# Patient Record
Sex: Male | Born: 1965 | Hispanic: Yes | Marital: Married | Smoking: Never smoker
Health system: Southern US, Community
[De-identification: ages and names within clinical notes are randomized; demographics above are authoritative.]

## PROBLEM LIST (undated history)

## (undated) DIAGNOSIS — G8929 Other chronic pain: Secondary | ICD-10-CM

## (undated) DIAGNOSIS — M51369 Other intervertebral disc degeneration, lumbar region without mention of lumbar back pain or lower extremity pain: Secondary | ICD-10-CM

## (undated) DIAGNOSIS — M109 Gout, unspecified: Secondary | ICD-10-CM

## (undated) DIAGNOSIS — M5136 Other intervertebral disc degeneration, lumbar region: Secondary | ICD-10-CM

## (undated) DIAGNOSIS — N400 Enlarged prostate without lower urinary tract symptoms: Secondary | ICD-10-CM

## (undated) DIAGNOSIS — M79606 Pain in leg, unspecified: Secondary | ICD-10-CM

## (undated) DIAGNOSIS — I10 Essential (primary) hypertension: Secondary | ICD-10-CM

## (undated) HISTORY — PX: LEG SURGERY: SHX1003

---

## 2015-01-28 ENCOUNTER — Encounter: Payer: Self-pay | Admitting: Emergency Medicine

## 2015-01-28 ENCOUNTER — Emergency Department
Admission: EM | Admit: 2015-01-28 | Discharge: 2015-01-28 | Disposition: A | Discharge status: Home / Self Care | Payer: Medicaid Other | Attending: Emergency Medicine | Admitting: Emergency Medicine

## 2015-01-28 DIAGNOSIS — G8921 Chronic pain due to trauma: Secondary | ICD-10-CM | POA: Insufficient documentation

## 2015-01-28 DIAGNOSIS — M10071 Idiopathic gout, right ankle and foot: Secondary | ICD-10-CM | POA: Diagnosis not present

## 2015-01-28 DIAGNOSIS — I10 Essential (primary) hypertension: Secondary | ICD-10-CM | POA: Diagnosis not present

## 2015-01-28 DIAGNOSIS — Z760 Encounter for issue of repeat prescription: Secondary | ICD-10-CM | POA: Diagnosis not present

## 2015-01-28 DIAGNOSIS — M109 Gout, unspecified: Secondary | ICD-10-CM

## 2015-01-28 HISTORY — DX: Gout, unspecified: M10.9

## 2015-01-28 HISTORY — DX: Other intervertebral disc degeneration, lumbar region: M51.36

## 2015-01-28 HISTORY — DX: Pain in leg, unspecified: M79.606

## 2015-01-28 HISTORY — DX: Benign prostatic hyperplasia without lower urinary tract symptoms: N40.0

## 2015-01-28 HISTORY — DX: Other chronic pain: G89.29

## 2015-01-28 HISTORY — DX: Essential (primary) hypertension: I10

## 2015-01-28 HISTORY — DX: Other intervertebral disc degeneration, lumbar region without mention of lumbar back pain or lower extremity pain: M51.369

## 2015-01-28 MED ORDER — NAPROXEN 500 MG PO TABS: 500.0000 mg | ORAL_TABLET | Freq: Two times a day (BID) | ORAL | Status: AC

## 2015-01-28 MED ORDER — ATENOLOL 25 MG PO TABS: 25.0000 mg | ORAL_TABLET | Freq: Two times a day (BID) | ORAL | Status: AC

## 2015-01-28 MED ORDER — HYDROCODONE-ACETAMINOPHEN 5-325 MG PO TABS
1.0000 | ORAL_TABLET | Freq: Once | ORAL | Status: AC
  Administered 2015-01-28: 1 via ORAL
  Filled 2015-01-28: qty 1

## 2015-01-28 MED ORDER — NAPROXEN 500 MG PO TABS: 500.0000 mg | ORAL_TABLET | Freq: Two times a day (BID) | ORAL | Status: DC

## 2015-01-28 MED ORDER — HYDROCHLOROTHIAZIDE 25 MG PO TABS: 25.0000 mg | ORAL_TABLET | Freq: Every day | ORAL | Status: DC

## 2015-01-28 MED ORDER — TAMSULOSIN HCL 0.4 MG PO CAPS: 0.4000 mg | ORAL_CAPSULE | Freq: Every day | ORAL | Status: DC

## 2015-01-28 MED ORDER — HYDROCODONE-ACETAMINOPHEN 5-325 MG PO TABS: 1.0000 | ORAL_TABLET | ORAL | Status: DC | PRN

## 2015-01-28 NOTE — ED Provider Notes (Signed)
[facility] Emergency Department Provider Note ____________________________________________  Time seen: Approximately 1:21 PM  I have reviewed the triage vital signs and the nursing notes.   HISTORY     Per the Spanish interpreter  Chief Complaint Medication Refill  HPI Dillon Acosta is a 49 y.o. male is here with complaint of acute gout flare after eating pork yesterday. Patient is [place] and states that he takes one medication for gout and another just for pain. He also has prescriptions for his prostate and blood pressure. He has bottles for all 3 and is requesting a refill on these as he is unsure how long he will be in the [place] since his wife is looking for a job. He denies having any difficulty with taking medication. He can only give the color of the pill that he is taking for gout. He denies any GI problems. Currently he rates his pain as a 7 out of 10. Pain is constant and increases with walking.   Past Medical History  Diagnosis Date  . Gout   . Hypertension   . BPH (benign prostatic hyperplasia)   . Chronic leg pain   . DDD (degenerative disc disease), lumbar     There are no active problems to display for this patient.   Past Surgical History  Procedure Laterality Date  . Leg surgery Left     Current Outpatient Rx  Name  Route  Sig  Dispense  Refill  . Naproxen Sodium (ANAPROX PO)   Oral   Take by mouth as needed (Daily).         Marland Kitchen. atenolol (TENORMIN) 25 MG tablet   Oral   Take 1 tablet (25 mg total) by mouth 2 (two) times daily.   60 tablet   0   . hydrochlorothiazide (HYDRODIURIL) 25 MG tablet   Oral   Take 1 tablet (25 mg total) by mouth daily.   30 tablet   0   . HYDROcodone-acetaminophen (NORCO/VICODIN) 5-325 MG tablet   Oral   Take 1 tablet by mouth every 4 (four) hours as needed for moderate pain.   20 tablet   0   . naproxen (NAPROSYN) 500 MG tablet   Oral   Take 1 tablet (500 mg total)  by mouth 2 (two) times daily with a meal.   30 tablet   0   . tamsulosin (FLOMAX) 0.4 MG CAPS capsule   Oral   Take 1 capsule (0.4 mg total) by mouth daily.   30 capsule   0     Allergies Review of patient's allergies indicates no known allergies.  No family history on file.  Social History Social History  Substance Use Topics  . Smoking status: Never Smoker   . Smokeless tobacco: None  . Alcohol Use: No    Review of Systems Constitutional: No fever/chills Cardiovascular: Denies chest pain. Respiratory: Denies shortness of breath. Gastrointestinal: No abdominal pain.  No nausea, no vomiting.   Musculoskeletal: Negative for back pain. Positive right foot pain. Positive left chronic leg pain secondary to trauma. Skin: Negative for rash. Chronic scar tissue left leg due to trauma. Neurological: Negative for headaches, focal weakness or numbness.  10-point ROS otherwise negative.  ____________________________________________   PHYSICAL EXAM:  VITAL SIGNS: ED Triage Vitals  Enc Vitals Group     BP 01/28/15 1306 129/84 mmHg     Pulse Rate 01/28/15 1306 69     Resp 01/28/15 1306 16  Temp 01/28/15 1306 97.4 F (36.3 C)     Temp Source 01/28/15 1306 Oral     SpO2 01/28/15 1306 97 %     Weight 01/28/15 1306 180 lb (81.647 kg)     Height 01/28/15 1306  (1.676 m)     Head Cir --      Peak Flow --      Pain Score 01/28/15 1251 7     Pain Loc --      Pain Edu? --      Excl. in GC? --     Constitutional: Alert and oriented. Well appearing and in no acute distress. Eyes: Conjunctivae are normal. PERRL. EOMI. Head: Atraumatic. Nose: No congestion/rhinnorhea. Neck: No stridor.   Cardiovascular: Normal rate, regular rhythm. Grossly normal heart sounds.  Good peripheral circulation. Respiratory: Normal respiratory effort.  No retractions. Lungs CTAB. Gastrointestinal: Soft and nontender. No distention. Musculoskeletal: No gross deformity of the right foot or  ankle. There is moderate tenderness to palpation and range of motion is slightly restricted secondary to pain. Neurologic:  Normal speech and language. No gross focal neurologic deficits are appreciated. No gait instability. Skin:  Skin is warm, dry and intact. Chronic scar tissue left leg secondary to trauma. Psychiatric: Mood and affect are normal. Speech and behavior are normal.  ____________________________________________   LABS (all labs ordered are listed, but only abnormal results are displayed)  Labs Reviewed - No data to display  PROCEDURES  Procedure(s) performed: None  Critical Care performed: No  ____________________________________________   INITIAL IMPRESSION / ASSESSMENT AND PLAN / ED COURSE  Pertinent labs & imaging results that were available during my care of the patient were reviewed by me and considered in my medical decision making (see chart for details).  Is uncertain what kind of medication patient is been taking for his gout while in [place] as she only noticed the color of the pill. However he is willing to take medication here that is used for gout. Patient was also given refill of his normal medication including atenolol 25 mg, Hydrocort thiazide 25 mg and Flomax 0.4 mg. Patient is aware that should his wife finally job he will be responsible for finding a PCP to continue his healthcare. He is also given a prescription for Norco as needed for severe pain and naproxen for gout and inflammation. ____________________________________________   FINAL CLINICAL IMPRESSION(S) / ED DIAGNOSES  Final diagnoses:  Acute gout of right foot, unspecified cause  Encounter for medication refill      Tommi Rumps, PA-C 01/28/15 1441  Phineas Semen, MD 01/28/15 [phone]

## 2015-01-28 NOTE — ED Notes (Signed)
Per spanish interpreter, pt left prescription for gout medication in [place], pt reports eating pork yesterday and now has a gout exacerbation. Pt reports scarring to left leg, reports he left his prescription for his pain medication [place] as well.

## 2015-01-28 NOTE — ED Notes (Signed)
Says pain in ankle.  Usually takes meds, but he left them in [place] and needs more

## 2015-01-28 NOTE — Discharge Instructions (Signed)
Gota  °(Gout) ° La gota es la irritación, dolor e hinchazón (inflamación) en las articulaciones. La causa de la gota es la acumulación de cristales de ácido úrico en las articulaciones. El ácido úrico es una sustancia química que normalmente se encuentra en la sangre. Si el nivel de ácido úrico en la sangre es muy alto, se forman cristales en las articulaciones y los tejidos. Con el tiempo, estos cristales pueden formar masas cerca de las articulaciones y los tejidos. Estas masas pueden destruir el hueso y hacer que se modifique (deformación). °CUIDADOS EN EL HOGAR  °· No tome aspirina para el dolor. °· Sólo tome los medicamentos que le indique el médico. °· Haga descansar las articulaciones todo el tiempo que pueda. Cuando se encuentre en la cama, mantenga las sábanas y mantas alejadas de las articulaciones doloridas. °· Mantenga las articulaciones levantadas (elevada). °· Aplique compresas frías o calientes sobre las articulaciones doloridas. El uso de compresas calientes o frías depende de lo que mejor le resulte a usted. °· Utilice muletas si la articulación que le duele es de la pierna. °· Beba gran cantidad de líquido para mantener el pis (orina) de tono claro o amarillo pálido. Limite el consumo de alcohol, las bebidas azucaradas y las que contengan fructosa. °· Siga las instrucciones de su dieta. . Preste especial atención a la cantidad de proteínas que consume. Incluya frutas, verduras, granos integrales y productos lácteos descremados o semi descremados en su dieta diaria. Hable con su médico o nutricionista sobre el consumo del café, vitamina C y cerezas. Estos pueden ayudar a disminuir los niveles de ácido úrico. °· Mantenga un peso corporal adecuado. °SOLICITE AYUDA DE INMEDIATO SI:  °· Tiene deposiciones líquidas (diarrea), vomita o si sufre algún efecto secundario por los medicamentos °· No se siente mejor en 24 horas, o empeora. °· Las articulaciones le duelen más de manera repentina, tiene  escalofríos o fiebre. °ASEGÚRESE DE QUE:  °· Comprende estas instrucciones. °· Controlará su enfermedad. °· Solicitará ayuda de inmediato si no mejora o si empeora. °  °Esta información no tiene como fin reemplazar el consejo del médico. Asegúrese de hacerle al médico cualquier pregunta que tenga. °  °Document Released: 02/08/2011 Document Revised: 03/12/2014 °Elsevier Interactive Patient Education ©2016 Elsevier Inc. ° °

## 2015-02-15 DIAGNOSIS — I10 Essential (primary) hypertension: Secondary | ICD-10-CM | POA: Insufficient documentation

## 2015-02-15 DIAGNOSIS — M109 Gout, unspecified: Secondary | ICD-10-CM | POA: Insufficient documentation

## 2015-02-15 DIAGNOSIS — G8929 Other chronic pain: Secondary | ICD-10-CM | POA: Insufficient documentation

## 2015-02-15 DIAGNOSIS — N400 Enlarged prostate without lower urinary tract symptoms: Secondary | ICD-10-CM | POA: Insufficient documentation

## 2015-02-15 DIAGNOSIS — M79606 Pain in leg, unspecified: Secondary | ICD-10-CM

## 2015-02-15 DIAGNOSIS — M5136 Other intervertebral disc degeneration, lumbar region: Secondary | ICD-10-CM | POA: Insufficient documentation

## 2015-02-17 ENCOUNTER — Ambulatory Visit (INDEPENDENT_AMBULATORY_CARE_PROVIDER_SITE_OTHER): Payer: Medicare Other | Admitting: Obstetrics and Gynecology

## 2015-02-17 ENCOUNTER — Encounter: Payer: Self-pay | Admitting: Obstetrics and Gynecology

## 2015-02-17 VITALS — BP 109/73 | HR 69 | Resp 16 | Ht 66.0 in | Wt 193.5 lb

## 2015-02-17 DIAGNOSIS — R972 Elevated prostate specific antigen [PSA]: Secondary | ICD-10-CM

## 2015-02-17 DIAGNOSIS — N3000 Acute cystitis without hematuria: Secondary | ICD-10-CM | POA: Diagnosis not present

## 2015-02-17 LAB — URINALYSIS, COMPLETE
BILIRUBIN UA: NEGATIVE
Glucose, UA: NEGATIVE
Ketones, UA: NEGATIVE
NITRITE UA: POSITIVE — AB
PH UA: 5.5 (ref 5.0–7.5)
Protein, UA: NEGATIVE
Specific Gravity, UA: 1.025 (ref 1.005–1.030)
UUROB: 0.2 mg/dL (ref 0.2–1.0)

## 2015-02-17 LAB — BLADDER SCAN AMB NON-IMAGING

## 2015-02-17 LAB — MICROSCOPIC EXAMINATION: EPITHELIAL CELLS (NON RENAL): NONE SEEN /HPF (ref 0–10)

## 2015-02-17 NOTE — Patient Instructions (Addendum)
  Please obtain from your previous provider:  PSA lab tests  Prostate biopsy pathology report  Previous antibiotic medications

## 2015-02-17 NOTE — Progress Notes (Signed)
02/17/2015 11:04 AM   Dillon Acosta 1965/04/13 [id number]  Referring provider: No referring provider defined for this encounter.  Chief Complaint  Patient presents with  . Elevated PSA  . Dysuria  . Establish Care    HPI: Patient is a 49 year old Hispanic male presenting today with a Spanish interpreter to establish urologic care. He reports a history of an elevated PSA status post prostate biopsy 7 months ago. He reports that he was told that his biopsy results were negative for cancer but that he had a severe prostate infection. He was scheduled for follow-up in January for recheck but has since moved to the [place] with his niece and would like to continue his urologic care here.  Urinary symptoms including occasional sensation of incomplete bladder emptying, dysuria and nocturia 3-4 times per night. He denies any day times frequency, urgency or incontinence. I-PSS: 10 QOL: mostly satisfied  He states that after his prostate biopsy he was started on Flomax as well as an antibiotic for him to take until his follow-up appointment. He reports minimal improvement in urinary symptoms.   FMH: Brother had metastatic prostate cancer.  Deceased at age 41.    PMH: Past Medical History  Diagnosis Date  . Gout   . Hypertension   . BPH (benign prostatic hyperplasia)   . Chronic leg pain   . DDD (degenerative disc disease), lumbar     Surgical History: Past Surgical History  Procedure Laterality Date  . Leg surgery Left     Home Medications:    Medication List       This list is accurate as of: 02/17/15 11:04 AM.  Always use your most recent med list.               atenolol 25 MG tablet  Commonly known as:  TENORMIN  Take 1 tablet (25 mg total) by mouth 2 (two) times daily.     buPROPion 150 MG 12 hr tablet  Commonly known as:  WELLBUTRIN SR  Take by mouth 2 (two) times daily.     hydrochlorothiazide 25 MG tablet  Commonly known as:  HYDRODIURIL  Take 1 tablet (25 mg  total) by mouth daily.     HYDROcodone-acetaminophen 5-325 MG tablet  Commonly known as:  NORCO/VICODIN  Take 1 tablet by mouth every 4 (four) hours as needed for moderate pain.     naproxen 500 MG tablet  Commonly known as:  NAPROSYN  Take 1 tablet (500 mg total) by mouth 2 (two) times daily with a meal.     pantoprazole 40 MG tablet  Commonly known as:  PROTONIX  Take 40 mg by mouth daily.     tamsulosin 0.4 MG Caps capsule  Commonly known as:  FLOMAX  Take 1 capsule (0.4 mg total) by mouth daily.     temazepam 7.5 MG capsule  Commonly known as:  RESTORIL  Take 7.5 mg by mouth at bedtime as needed for sleep.        Allergies: No Known Allergies  Family History: Family History  Problem Relation Age of Onset  . Prostate cancer Brother     Social History:  reports that he has never smoked. He does not have any smokeless tobacco history on file. He reports that he does not drink alcohol. His drug history is not on file.  ROS: UROLOGY Frequent Urination?: Yes Hard to postpone urination?: Yes Burning/pain with urination?: No Get up at night to urinate?: Yes Leakage of urine?: Yes Urine  stream starts and stops?: Yes Trouble starting stream?: No Do you have to strain to urinate?: No Blood in urine?: No Urinary tract infection?: Yes Sexually transmitted disease?: No Injury to kidneys or bladder?: No Painful intercourse?: No Weak stream?: No Erection problems?: No Penile pain?: No  Gastrointestinal Nausea?: No Vomiting?: No Indigestion/heartburn?: Yes Diarrhea?: No Constipation?: Yes  Constitutional Fever: No Night sweats?: No Weight loss?: No Fatigue?: No  Skin Skin rash/lesions?: No Itching?: No  Eyes Blurred vision?: Yes Double vision?: No  Ears/Nose/Throat Sore throat?: No Sinus problems?: Yes  Hematologic/Lymphatic Swollen glands?: No Easy bruising?: Yes  Cardiovascular Leg swelling?: No Chest pain?: No  Respiratory Cough?:  No Shortness of breath?: No  Endocrine Excessive thirst?: Yes  Musculoskeletal Back pain?: Yes Joint pain?: Yes  Neurological Headaches?: Yes Dizziness?: No  Psychologic Depression?: Yes Anxiety?: Yes  Physical Exam: BP 109/73 mmHg  Pulse 69  Resp 16  Ht 5\' 6"  (1.676 m)  Wt 193 lb 8 oz (87.771 kg)  BMI 31.25 kg/m2  Constitutional:  Alert and oriented, No acute distress. HEENT: [place] AT, moist mucus membranes.  Trachea midline, no masses. Cardiovascular: No clubbing, cyanosis, or edema. Respiratory: Normal respiratory effort, no increased work of breathing. GI: Abdomen is soft, nontender, nondistended, no abdominal masses GU: No CVA tenderness. Normal circumcised phallus, patent urinary meatus, testicles descended bilaterally without palpable masses or tenderness  DRE: small smooth slightly firm prostate, nontender Skin: No rashes, bruises or suspicious lesions. Lymph: No cervical or inguinal adenopathy. Neurologic: Grossly intact, no focal deficits, moving all 4 extremities. Psychiatric: Normal mood and affect.  Laboratory Data:   Urinalysis Results for orders placed or performed in visit on 02/17/15  Microscopic Examination  Result Value Ref Range   WBC, UA 11-30 (A) 0 -  5 /hpf   RBC, UA 0-2 0 -  2 /hpf   Epithelial Cells (non renal) None seen 0 - 10 /hpf   Bacteria, UA Many (A) None seen/Few  Urinalysis, Complete  Result Value Ref Range   Specific Gravity, UA 1.025 1.005 - 1.030   pH, UA 5.5 5.0 - 7.5   Color, UA Yellow Yellow   Appearance Ur Clear Clear   Leukocytes, UA Trace (A) Negative   Protein, UA Negative Negative/Trace   Glucose, UA Negative Negative   Ketones, UA Negative Negative   RBC, UA Trace (A) Negative   Bilirubin, UA Negative Negative   Urobilinogen, Ur 0.2 0.2 - 1.0 mg/dL   Nitrite, UA Positive (A) Negative   Microscopic Examination See below:   BLADDER SCAN AMB NON-IMAGING  Result Value Ref Range   Scan Result 117 mL       Pertinent Imaging:   Assessment & Plan:    1. Elevated PSA- Prostate slightly firm on DRE or 3 otherwise normal exam. PSA draw deferred until next visit after UTI treatment complete. - Urinalysis, Complete - BLADDER SCAN AMB NON-IMAGING  2. Urinary tract infection-  UA today suspicious for infection.  30 WBCs, 0-2 RBCs, many bacteria and is nitrite positive. We'll treat pending culture results. - CULTURE, URINE COMPREHENSIVE  3. Nocturia- 3-4 times per night.  Unclear if patient is still taking his Flomax. Will address further at next visit.  Additional notes and/or imaging study requested from patient's previous Urologist in [place].  Patient states that he will request them to be faxed to us.  Return in about 1 month (around 03/20/2015) for PSA draw 2 days prior to appt.  These notes generated with voice  recognition software. I apologize for typographical errors.  Earlie Lou, FNP  [facility] [address], [address] [place], [place] [address] [phone]

## 2015-02-20 LAB — CULTURE, URINE COMPREHENSIVE

## 2015-02-21 ENCOUNTER — Telehealth: Payer: Self-pay

## 2015-02-21 DIAGNOSIS — N390 Urinary tract infection, site not specified: Secondary | ICD-10-CM

## 2015-02-21 MED ORDER — NITROFURANTOIN MONOHYD MACRO 100 MG PO CAPS
100.0000 mg | ORAL_CAPSULE | Freq: Two times a day (BID) | ORAL | Status: AC
Start: 2015-02-21 — End: 2015-02-28

## 2015-02-21 NOTE — Telephone Encounter (Signed)
Spoke with pt via interpreter. Made pt aware of +ucx and abx sent to pharmacy. Pt stated that he picked up his flomax yesterday and started taking it.

## 2015-02-21 NOTE — Telephone Encounter (Signed)
-----   Message from Fernanda DrumLindsay C Overton, FNP sent at 02/21/2015 10:33 AM EST ----- Please notify patient yes Spanish interpreter that his urine culture results were positive for infection. I would like him to be treated with Macrobid 100 mg twice daily 1 week. He can keep his follow-up appointment as scheduled in 1 month. Patient was unclear during his visit whether or not he was taking his Flomax area we please question him further on this and if he is not taking it send in a prescription for once daily Flomax 0.4 mg as well. Thank you

## 2015-03-10 ENCOUNTER — Telehealth: Payer: Self-pay | Admitting: Obstetrics and Gynecology

## 2015-03-10 DIAGNOSIS — N390 Urinary tract infection, site not specified: Secondary | ICD-10-CM

## 2015-03-10 MED ORDER — NITROFURANTOIN MONOHYD MACRO 100 MG PO CAPS: 100.0000 mg | ORAL_CAPSULE | Freq: Two times a day (BID) | ORAL | Status: AC

## 2015-03-10 NOTE — Telephone Encounter (Signed)
Pt called and I spoke with someone who speaks [place].  They stated [facility] [place] [facility] for infection and nothing was called in.  Can you please give pt a call at [phone].

## 2015-03-10 NOTE — Telephone Encounter (Signed)
Attempted to call pt and was unable to reach. Medication was sent to [facility] on [address]. Med[facility] to [facility] [place].

## 2015-03-17 ENCOUNTER — Telehealth: Payer: Self-pay | Admitting: Obstetrics and Gynecology

## 2015-03-17 NOTE — Telephone Encounter (Signed)
Per patient's request, I faxed request to patient's former urology physician for patient's records to be sent to our office.  Phone: [phone] Fax: [phone]) [phone]

## 2015-03-22 ENCOUNTER — Other Ambulatory Visit: Payer: Medicare Other

## 2015-03-22 DIAGNOSIS — R972 Elevated prostate specific antigen [PSA]: Secondary | ICD-10-CM

## 2015-03-23 LAB — PSA TOTAL (REFLEX TO FREE): PROSTATE SPECIFIC AG, SERUM: 8.7 ng/mL — AB (ref 0.0–4.0)

## 2015-03-23 LAB — FPSA% REFLEX
% FREE PSA: 6.7 %
PSA, FREE: 0.58 ng/mL

## 2015-03-24 ENCOUNTER — Encounter: Payer: Self-pay | Admitting: Obstetrics and Gynecology

## 2015-03-24 ENCOUNTER — Ambulatory Visit (INDEPENDENT_AMBULATORY_CARE_PROVIDER_SITE_OTHER): Payer: Medicare (Managed Care) | Admitting: Obstetrics and Gynecology

## 2015-03-24 ENCOUNTER — Telehealth: Payer: Self-pay | Admitting: Obstetrics and Gynecology

## 2015-03-24 VITALS — BP 144/83 | HR 71 | Resp 18 | Ht 66.0 in | Wt 196.7 lb

## 2015-03-24 DIAGNOSIS — R351 Nocturia: Secondary | ICD-10-CM

## 2015-03-24 DIAGNOSIS — R972 Elevated prostate specific antigen [PSA]: Secondary | ICD-10-CM

## 2015-03-24 DIAGNOSIS — R109 Unspecified abdominal pain: Secondary | ICD-10-CM

## 2015-03-24 DIAGNOSIS — Z87442 Personal history of urinary calculi: Secondary | ICD-10-CM

## 2015-03-24 DIAGNOSIS — N390 Urinary tract infection, site not specified: Secondary | ICD-10-CM

## 2015-03-24 LAB — MICROSCOPIC EXAMINATION
EPITHELIAL CELLS (NON RENAL): NONE SEEN /HPF (ref 0–10)
RBC MICROSCOPIC, UA: NONE SEEN /HPF (ref 0–?)

## 2015-03-24 LAB — URINALYSIS, COMPLETE
Bilirubin, UA: NEGATIVE
KETONES UA: NEGATIVE
NITRITE UA: POSITIVE — AB
Protein, UA: NEGATIVE
UUROB: 0.2 mg/dL (ref 0.2–1.0)
pH, UA: 6 (ref 5.0–7.5)

## 2015-03-24 MED ORDER — TAMSULOSIN HCL 0.4 MG PO CAPS
0.4000 mg | ORAL_CAPSULE | Freq: Every day | ORAL | Status: AC
Start: 2015-03-24 — End: ?

## 2015-03-24 NOTE — Telephone Encounter (Signed)
I sent him the refill, please notify thanks

## 2015-03-24 NOTE — Progress Notes (Signed)
9:27 AM   Dillon Acosta Apr 28, 1965 [id number]  Referring provider: No referring provider defined for this encounter.  Chief Complaint  Patient presents with  . Results  . Elevated PSA    HPI: Patient is a 50 year old Hispanic male presenting today with a Spanish interpreter to establish urologic care. He reports a history of an elevated PSA status post prostate biopsy 7 months ago. He reports that he was told that his biopsy results were negative for cancer but that he had a severe prostate infection. He was scheduled for follow-up in January for recheck but has since moved to the [place] with his niece and would like to continue his urologic care here.  Urinary symptoms including occasional sensation of incomplete bladder emptying, dysuria and nocturia 3-4 times per night. He denies any day times frequency, urgency or incontinence. I-PSS: 10 QOL: mostly satisfied  He states that after his prostate biopsy he was started on Flomax as well as an antibiotic for him to take until his follow-up appointment. He reports minimal improvement in urinary symptoms.   FMH: Brother had metastatic prostate cancer.  Deceased at age 84.   Current Status: Patient reports that he requested his notes from [place] but we have not received a fax. He reports that his symptoms are unchanged. He has been taking his daily Flomax but did not pick up his antibiotic because it was not covered by his insurance. He denies any fevers or flank pain. No gross hematuria.  PMH: Past Medical History  Diagnosis Date  . Gout   . Hypertension   . BPH (benign prostatic hyperplasia)   . Chronic leg pain   . DDD (degenerative disc disease), lumbar     Surgical History: Past Surgical History  Procedure Laterality Date  . Leg surgery Left     Home Medications:    Medication List       This list is accurate as of: 03/24/15  9:27 AM.  Always use your most recent med list.               atenolol 25 MG tablet   Commonly known as:  TENORMIN  Take 1 tablet (25 mg total) by mouth 2 (two) times daily.     buPROPion 150 MG 12 hr tablet  Commonly known as:  WELLBUTRIN SR  Take by mouth 2 (two) times daily.     hydrochlorothiazide 25 MG tablet  Commonly known as:  HYDRODIURIL  Take 1 tablet (25 mg total) by mouth daily.     naproxen 500 MG tablet  Commonly known as:  NAPROSYN  Take 1 tablet (500 mg total) by mouth 2 (two) times daily with a meal.     pantoprazole 40 MG tablet  Commonly known as:  PROTONIX  Take 40 mg by mouth daily.     tamsulosin 0.4 MG Caps capsule  Commonly known as:  FLOMAX  Take 1 capsule (0.4 mg total) by mouth daily.     temazepam 7.5 MG capsule  Commonly known as:  RESTORIL  Take 7.5 mg by mouth at bedtime as needed for sleep.        Allergies: No Known Allergies  Family History: Family History  Problem Relation Age of Onset  . Prostate cancer Brother     Social History:  reports that he has never smoked. He does not have any smokeless tobacco history on file. He reports that he does not drink alcohol. His drug history is not on file.  ROS:  UROLOGY Frequent Urination?: Yes Hard to postpone urination?: No Burning/pain with urination?: Yes Get up at night to urinate?: Yes Leakage of urine?: Yes Urine stream starts and stops?: No Trouble starting stream?: No Do you have to strain to urinate?: No Blood in urine?: No Urinary tract infection?: No Sexually transmitted disease?: No Injury to kidneys or bladder?: No Painful intercourse?: No Weak stream?: No Erection problems?: Yes Penile pain?: No  Gastrointestinal Nausea?: No Vomiting?: No Indigestion/heartburn?: Yes Diarrhea?: No Constipation?: Yes  Constitutional Fever: No Night sweats?: No Weight loss?: No Fatigue?: No  Skin Skin rash/lesions?: No Itching?: No  Eyes Blurred vision?: No Double vision?: No  Ears/Nose/Throat Sore throat?: No Sinus problems?:  No  Hematologic/Lymphatic Swollen glands?: No Easy bruising?: No  Cardiovascular Leg swelling?: No Chest pain?: No  Respiratory Cough?: No Shortness of breath?: No  Endocrine Excessive thirst?: Yes  Musculoskeletal Back pain?: Yes Joint pain?: No  Neurological Headaches?: No Dizziness?: No  Psychologic Depression?: No Anxiety?: No  Physical Exam: BP 144/83 mmHg  Pulse 71  Resp 18  Ht  (1.676 m)  Wt 196 lb 11.2 oz (89.223 kg)  BMI 31.76 kg/m2  Constitutional:  Alert and oriented, No acute distress. HEENT: [place] AT, moist mucus membranes.  Trachea midline, no masses. Cardiovascular: No clubbing, cyanosis, or edema. Respiratory: Normal respiratory effort, no increased work of breathing. Skin: No rashes, bruises or suspicious lesions. Neurologic: Grossly intact, no focal deficits, moving all 4 extremities. Psychiatric: Normal mood and affect.  Laboratory Data:   Urinalysis Results for orders placed or performed in visit on 03/22/15  PSA Total (Reflex To Free)  Result Value Ref Range   Prostate Specific Ag, Serum 8.7 (H) 0.0 - 4.0 ng/mL   REFLEX CRITERIA Comment   %fPSA Reflex  Result Value Ref Range   PSA, FREE 0.58 N/A ng/mL   % FREE PSA 6.7 %     Pertinent Imaging:   Assessment & Plan:    1. Elevated PSA- Prostate slightly firm on DRE otherwise normal exam. ASA was elevated at 8.7 but this was in the setting of a urinary tract infection the patient did not pick his previously prescribed antibiotics as directed. We will have him complete his antibiotics and follow-up for repeat PSA in 4 weeks.He will once again requested notes including prostate biopsy report from [place] - Urinalysis, Complete - BLADDER SCAN AMB NON-IMAGING  2. Urinary tract infection- patient did not pick up his antibiotic as previously directed. He states that he has insurance would not cover it and he will pick up today. He will complete treatment of his urinary tract  infection and follow-up for repeat PSA in 4 weeks. - CULTURE, URINE COMPREHENSIVE  3. Nocturia- 3-4 times per night.  Unclear if patient is still taking his Flomax. Will address further at next visit.  4. H/o kidney stones-Patient states he also has history of kidney stones and is very concerned about the number he may have in his kidneys. He is requesting that we evaluate this further. Renal ultrasound and KUB ordered -KUB RUS  Additional notes and/or imaging study requested from patient's previous Urologist in [place]  Patient states that he will request them to be faxed to [place].  Return in about 4 weeks (around 04/21/2015) for review KUB/RUS/PSA results; UA/PVR.  These notes generated with voice recognition software. I apologize for typographical errors.  Earlie Lou, FNP  [facility] [address] [place], [place] [address] [phone]

## 2015-03-24 NOTE — Patient Instructions (Signed)
prostatBiopsia transrectal guiada por ultrasonido (Transrectal Ultrasound-Guided Biopsy) La biopsia transrectal guiada por ultrasonido es un procedimiento para tomar muestras de tejido de la prstata mediante el uso de imgenes de ultrasonido para guiarlo. Generalmente, se realiza para evaluar la prstata de los hombres cuyo antgeno prosttico especfico (PSA) est elevado. El antgeno prosttico especfico es un anlisis de sangre que se [place] para Arts administrator de prstata. Se toman muestras de tejido para Clinical cytogeneticist de prstata.  INFORME A SU MDICO:  Cualquier alergia que tenga.  Todos los [employer], incluidos vitaminas, hierbas, gotas oftlmicas, cremas y [address] de [address].  Problemas previos que usted o los [employer] de su familia hayan tenido con el uso de anestsicos.  Enfermedades de la sangre que tenga.  Cirugas previas.  Enfermedades que tenga. RIESGOS Y COMPLICACIONES En general, se trata de un procedimiento seguro. Sin embargo, Photographer, pueden surgir problemas. Algunos posibles problemas incluyen:  Infeccin de la prstata.  Sangrado rectal o sangre en la orina.  Dificultad para orinar.  Dao a los nervios (suele ser pasajero).  Dao a las estructuras circundantes, como vasos sanguneos, rganos y [employer] requerir otros procedimientos. ANTES DEL PROCEDIMIENTO  No coma ni beba nada despus de la medianoche anterior al procedimiento o segn le haya indicado su mdico.  Tome los medicamentos solamente como se lo haya indicado el mdico.  Es posible que el mdico le indique que suspenda ciertos medicamentos 5 a 7das antes del procedimiento.  Le harn un enema antes del procedimiento. Durante un enema, se inyecta lquido en el recto para que se eliminen los desechos.  Tal vez le realicen anlisis de sangre el da del procedimiento.  Haga planes para que una persona lo lleve de vuelta a su casa  despus del procedimiento. PROCEDIMIENTO   Antes del procedimiento, le darn un medicamento para ayudarlo a que se relaje (sedante). Le insertarn una va intravenosa (IV) en una de las venas para administrarle lquidos y medicamentos.  Le recetarn un antibitico para reducir el riesgo de infeccin.  Para el procedimiento, lo colocarn de costado.  Se introducir una sonda con gel lubricante en el recto y se tomarn imgenes de la prstata y las estructuras circundantes.  Se inyectar un anestsico en la prstata antes de tomar las muestras de tejido de la biopsia.  Luego, se introducir una aguja para biopsia que se guiar [employer] prstata mediante el uso de imgenes de [place].  Se tomarn muestras de tejido prosttico y se Multimedia programmer.  Las muestras se enviarn a un laboratorio para su anlisis. Generalmente, los resultados estn listos en 2 o 3das. DESPUS DEL PROCEDIMIENTO  Lo llevarn al rea de recuperacin donde lo controlarn.  Tal vez tenga ciertas molestias en la zona del recto. Le darn analgsicos para Human resources officer.  Es posible que pueda volver a casa el mismo da del procedimiento, o puede Environmental health practitioner en observacin en el hospital durante la noche.   Esta informacin no tiene Theme park manager el consejo del mdico. Asegrese de hacerle al mdico cualquier pregunta que tenga.   Document Released: 07/06/2013 Document Revised: 03/12/2014 Elsevier Interactive Patient Education [employer]

## 2015-03-24 NOTE — Telephone Encounter (Signed)
Pt's niece called stating that pt was seen this morning and that his Flomax is out.  Pt needs a refill called in to [facility] in [place].

## 2015-03-26 LAB — CULTURE, URINE COMPREHENSIVE

## 2015-03-28 ENCOUNTER — Telehealth: Payer: Self-pay

## 2015-03-28 NOTE — Telephone Encounter (Signed)
Spoke with pt via interpreter. Made aware of +ucx, complete given abx, call back if not any better, otherwise we will see him in 17mo. Pt voiced understanding.

## 2015-03-28 NOTE — Telephone Encounter (Signed)
-----   Message from Fernanda Drum, FNP sent at 03/28/2015  9:32 AM EST ----- Please notify patient via Spanish interpreter that his urine culture was positive for infection once again. He needs to make sure that he completes his antibiotic entirely as directed. He does notify our office if his symptoms do not improve prior to his next appointment in 1 month. Thanks

## 2015-04-12 ENCOUNTER — Ambulatory Visit
Admission: RE | Admit: 2015-04-12 | Discharge: 2015-04-12 | Disposition: A | Discharge status: Home / Self Care | Payer: Medicare Other | Source: Ambulatory Visit | Attending: Obstetrics and Gynecology | Admitting: Obstetrics and Gynecology

## 2015-04-12 DIAGNOSIS — N281 Cyst of kidney, acquired: Secondary | ICD-10-CM | POA: Diagnosis not present

## 2015-04-12 DIAGNOSIS — R351 Nocturia: Secondary | ICD-10-CM | POA: Diagnosis not present

## 2015-04-12 DIAGNOSIS — R109 Unspecified abdominal pain: Secondary | ICD-10-CM | POA: Insufficient documentation

## 2015-04-12 DIAGNOSIS — Z87442 Personal history of urinary calculi: Secondary | ICD-10-CM | POA: Insufficient documentation

## 2015-04-12 DIAGNOSIS — N390 Urinary tract infection, site not specified: Secondary | ICD-10-CM | POA: Insufficient documentation

## 2015-04-18 ENCOUNTER — Other Ambulatory Visit: Payer: Medicare Other

## 2015-04-18 ENCOUNTER — Ambulatory Visit
Admission: RE | Admit: 2015-04-18 | Discharge: 2015-04-18 | Disposition: A | Discharge status: Home / Self Care | Payer: Medicare Other | Source: Ambulatory Visit | Attending: Obstetrics and Gynecology | Admitting: Obstetrics and Gynecology

## 2015-04-18 DIAGNOSIS — N390 Urinary tract infection, site not specified: Secondary | ICD-10-CM | POA: Diagnosis present

## 2015-04-18 DIAGNOSIS — R351 Nocturia: Secondary | ICD-10-CM

## 2015-04-18 DIAGNOSIS — Z87442 Personal history of urinary calculi: Secondary | ICD-10-CM | POA: Insufficient documentation

## 2015-04-18 DIAGNOSIS — R972 Elevated prostate specific antigen [PSA]: Secondary | ICD-10-CM

## 2015-04-18 DIAGNOSIS — R1011 Right upper quadrant pain: Secondary | ICD-10-CM | POA: Diagnosis not present

## 2015-04-19 LAB — FPSA% REFLEX
% FREE PSA: 6.4 %
PSA, FREE: 0.55 ng/mL

## 2015-04-19 LAB — PSA TOTAL (REFLEX TO FREE): Prostate Specific Ag, Serum: 8.6 ng/mL — ABNORMAL HIGH (ref 0.0–4.0)

## 2015-04-21 ENCOUNTER — Ambulatory Visit (INDEPENDENT_AMBULATORY_CARE_PROVIDER_SITE_OTHER): Payer: Medicare Other | Admitting: Obstetrics and Gynecology

## 2015-04-21 ENCOUNTER — Encounter: Payer: Self-pay | Admitting: Obstetrics and Gynecology

## 2015-04-21 VITALS — BP 134/81 | HR 87 | Resp 16 | Ht 66.0 in | Wt 199.1 lb

## 2015-04-21 DIAGNOSIS — N390 Urinary tract infection, site not specified: Secondary | ICD-10-CM

## 2015-04-21 DIAGNOSIS — R972 Elevated prostate specific antigen [PSA]: Secondary | ICD-10-CM | POA: Diagnosis not present

## 2015-04-21 LAB — URINALYSIS, COMPLETE
BILIRUBIN UA: NEGATIVE
KETONES UA: NEGATIVE
Nitrite, UA: NEGATIVE
PH UA: 7 (ref 5.0–7.5)
Protein, UA: NEGATIVE
SPEC GRAV UA: 1.015 (ref 1.005–1.030)
UUROB: 0.2 mg/dL (ref 0.2–1.0)

## 2015-04-21 LAB — MICROSCOPIC EXAMINATION: RBC MICROSCOPIC, UA: NONE SEEN /HPF (ref 0–?)

## 2015-04-21 LAB — BLADDER SCAN AMB NON-IMAGING

## 2015-04-21 NOTE — Progress Notes (Signed)
2:09 PM   Romie Tay Pagan Nov 23, 1965 [id number]  Referring provider: No referring provider defined for this encounter.  Chief Complaint  Patient presents with  . Recurrent UTI  . Elevated PSA  . Results    KUB/RUS/PSA    HPI: Patient is a 50 year old Hispanic male presenting today with a Spanish interpreter to establish urologic care. He reports a history of an elevated PSA status post prostate biopsy 7 months ago. He reports that he was told that his biopsy results were negative for cancer but that he had a severe prostate infection. He was scheduled for follow-up in January for recheck but has since moved to the [place] with his niece and would like to continue his urologic care here.  Urinary symptoms including occasional sensation of incomplete bladder emptying, dysuria and nocturia 3-4 times per night. He denies any day times frequency, urgency or incontinence. I-PSS: 10 QOL: mostly satisfied  He states that after his prostate biopsy he was started on Flomax as well as an antibiotic for him to take until his follow-up appointment. He reports minimal improvement in urinary symptoms.   FMH: Brother had metastatic prostate cancer.  Deceased at age 47.   Current Status: Records obtained from patient's urologist in [place] Prostate biopsy benign on 10/19/14 performed for an elevated PSA of 4.17. Asian has completed his antibiotic as prescribed and has continued his daily Flomax. He reports minimal improvement in urinary symptoms.  PSA History: 04/21/15 PSA 12.4  04/18/15 PSA 8.6 %Free 6.4  03/22/15  PSA 8.7 (in the setting of UTI)  10/05/14   PSA 4.27 08/17/14 PSA 3.21     PMH: Past Medical History  Diagnosis Date  . Gout   . Hypertension   . BPH (benign prostatic hyperplasia)   . Chronic leg pain   . DDD (degenerative disc disease), lumbar     Surgical History: Past Surgical History  Procedure Laterality Date  . Leg surgery Left     Home Medications:    Medication  List       This list is accurate as of: 04/21/15  2:09 PM.  Always use your most recent med list.               atenolol 25 MG tablet  Commonly known as:  TENORMIN  Take 1 tablet (25 mg total) by mouth 2 (two) times daily.     buPROPion 150 MG 12 hr tablet  Commonly known as:  WELLBUTRIN SR  Take by mouth 2 (two) times daily.     naproxen 500 MG tablet  Commonly known as:  NAPROSYN  Take 1 tablet (500 mg total) by mouth 2 (two) times daily with a meal.     nitrofurantoin 100 MG capsule  Commonly known as:  MACRODANTIN  Take 100 mg by mouth 2 (two) times daily.     pantoprazole 40 MG tablet  Commonly known as:  PROTONIX  Take 40 mg by mouth daily.     tamsulosin 0.4 MG Caps capsule  Commonly known as:  FLOMAX  Take 1 capsule (0.4 mg total) by mouth daily.     temazepam 7.5 MG capsule  Commonly known as:  RESTORIL  Take 7.5 mg by mouth at bedtime as needed for sleep.        Allergies: No Known Allergies  Family History: Family History  Problem Relation Age of Onset  . Prostate cancer Brother     Social History:  reports that he has never smoked. He  does not have any smokeless tobacco history on file. He reports that he does not drink alcohol. His drug history is not on file.  ROS: UROLOGY Frequent Urination?: Yes Hard to postpone urination?: Yes Burning/pain with urination?: No Get up at night to urinate?: Yes Leakage of urine?: No Urine stream starts and stops?: No Trouble starting stream?: No Do you have to strain to urinate?: No Blood in urine?: No Urinary tract infection?: Yes Sexually transmitted disease?: No Injury to kidneys or bladder?: No Painful intercourse?: No Weak stream?: No Erection problems?: No Penile pain?: No  Gastrointestinal Nausea?: No Vomiting?: No Indigestion/heartburn?: No Diarrhea?: No Constipation?: Yes  Constitutional Fever: No Night sweats?: No Weight loss?: No Fatigue?: No  Skin Skin rash/lesions?:  No Itching?: No  Eyes Blurred vision?: No Double vision?: No  Ears/Nose/Throat Sore throat?: No Sinus problems?: Yes  Hematologic/Lymphatic Swollen glands?: No Easy bruising?: Yes  Cardiovascular Leg swelling?: No Chest pain?: No  Respiratory Cough?: No Shortness of breath?: No  Endocrine Excessive thirst?: Yes  Musculoskeletal Back pain?: Yes Joint pain?: No  Neurological Headaches?: Yes Dizziness?: No  Psychologic Depression?: Yes Anxiety?: Yes  Physical Exam: BP 134/81 mmHg  Pulse 87  Resp 16  Ht  (1.676 m)  Wt 199 lb 1.6 oz (90.311 kg)  BMI 32.15 kg/m2  Constitutional:  Alert and oriented, No acute distress. HEENT:  AT, moist mucus membranes.  Trachea midline, no masses. Cardiovascular: No clubbing, cyanosis, or edema. Respiratory: Normal respiratory effort, no increased work of breathing. Skin: No rashes, bruises or suspicious lesions. Neurologic: Grossly intact, no focal deficits, moving all 4 extremities. Psychiatric: Normal mood and affect.  Laboratory Data:   Urinalysis Results for orders placed or performed in visit on 04/21/15  BLADDER SCAN AMB NON-IMAGING  Result Value Ref Range   Scan Result 42 mL      Pertinent Imaging:   Assessment & Plan:    1. Elevated PSA- Prostate slightly firm on DRE otherwise normal exam. PSA was elevated at 8.7 but this was in the setting of a urinary tract infection the patient did not pick his previously prescribed antibiotics as directed. UTI resolved and patient's PSA was rechecked 3 days ago and remained elevated at 8.3. I discussed implications of his significantly elevated/rising PSA. He understands that his biopsy performed in [place] may have missed a potential prostate cancer. I recommended that he undergo another prostate biopsy given the significant rise in his PSA and family history of prostate cancer. Patient states that he is planning to return to [place] in a few weeks and would  like to continue his urologic care there.  Will provide patient with records. I emphasized that it is very important that he follow up as soon as possible with his urologist in [place] - Urinalysis, Complete - BLADDER SCAN AMB NON-IMAGING  2. Urinary tract infection- Resolved.  UA unremarkable today.  3. Nocturia- 3-4 times per night.  Somewhat improved on Flomax.  Will continue,  4. H/o kidney stones-Patient states he also has history of kidney stones and is very concerned about the number he may have in his kidneys. He is requesting that we evaluate this further. 04/18/15 Renal ultrasound and KUB negative for evidence of stones.   No Follow-up on file.  These notes generated with voice recognition software. I apologize for typographical errors.  Earlie Lou, FNP  [facility] [address] [place], [place] [address] [phone]

## 2015-04-21 NOTE — Patient Instructions (Signed)
Biopsia transrectal guiada por ultrasonido (Transrectal Ultrasound-Guided Biopsy) La biopsia transrectal guiada por ultrasonido es un procedimiento para tomar muestras de tejido de la prstata mediante el uso de imgenes de ultrasonido para guiarlo. Generalmente, se realiza para evaluar la prstata de los hombres cuyo antgeno prosttico especfico (PSA) est elevado. El antgeno prosttico especfico es un anlisis de sangre que se [place] para Arts administrator de prstata. Se toman muestras de tejido para Clinical cytogeneticist de prstata.  INFORME A SU MDICO:  Cualquier alergia que tenga.  Todos los [employer], incluidos vitaminas, hierbas, gotas oftlmicas, cremas y [address] de [address].  Problemas previos que usted o los [employer] de su familia hayan tenido con el uso de anestsicos.  Enfermedades de la sangre que tenga.  Cirugas previas.  Enfermedades que tenga. RIESGOS Y COMPLICACIONES En general, se trata de un procedimiento seguro. Sin embargo, Photographer, pueden surgir problemas. Algunos posibles problemas incluyen:  Infeccin de la prstata.  Sangrado rectal o sangre en la orina.  Dificultad para orinar.  Dao a los nervios (suele ser pasajero).  Dao a las estructuras circundantes, como vasos sanguneos, rganos y [employer] requerir otros procedimientos. ANTES DEL PROCEDIMIENTO  No coma ni beba nada despus de la medianoche anterior al procedimiento o segn le haya indicado su mdico.  Tome los medicamentos solamente como se lo haya indicado el mdico.  Es posible que el mdico le indique que suspenda ciertos medicamentos 5 a 7das antes del procedimiento.  Le harn un enema antes del procedimiento. Durante un enema, se inyecta lquido en el recto para que se eliminen los desechos.  Tal vez le realicen anlisis de sangre el da del procedimiento.  Haga planes para que una persona lo lleve de vuelta a su casa despus  del procedimiento. PROCEDIMIENTO   Antes del procedimiento, le darn un medicamento para ayudarlo a que se relaje (sedante). Le insertarn una va intravenosa (IV) en una de las venas para administrarle lquidos y medicamentos.  Le recetarn un antibitico para reducir el riesgo de infeccin.  Para el procedimiento, lo colocarn de costado.  Se introducir una sonda con gel lubricante en el recto y se tomarn imgenes de la prstata y las estructuras circundantes.  Se inyectar un anestsico en la prstata antes de tomar las muestras de tejido de la biopsia.  Luego, se introducir una aguja para biopsia que se guiar [employer] prstata mediante el uso de imgenes de [place].  Se tomarn muestras de tejido prosttico y se Multimedia programmer.  Las muestras se enviarn a un laboratorio para su anlisis. Generalmente, los resultados estn listos en 2 o 3das. DESPUS DEL PROCEDIMIENTO  Lo llevarn al rea de recuperacin donde lo controlarn.  Tal vez tenga ciertas molestias en la zona del recto. Le darn analgsicos para Human resources officer.  Es posible que pueda volver a casa el mismo da del procedimiento, o puede Environmental health practitioner en observacin en el hospital durante la noche.   Esta informacin no tiene Theme park manager el consejo del mdico. Asegrese de hacerle al mdico cualquier pregunta que tenga.   Document Released: 07/06/2013 Document Revised: 03/12/2014 Elsevier Interactive Patient Education [employer]

## 2015-04-22 LAB — PSA: PROSTATE SPECIFIC AG, SERUM: 12.4 ng/mL — AB (ref 0.0–4.0)

## 2015-04-23 LAB — CULTURE, URINE COMPREHENSIVE

## 2015-04-25 ENCOUNTER — Telehealth: Payer: Self-pay

## 2015-04-25 DIAGNOSIS — N390 Urinary tract infection, site not specified: Secondary | ICD-10-CM

## 2015-04-25 MED ORDER — SULFAMETHOXAZOLE-TRIMETHOPRIM 800-160 MG PO TABS: 1.0000 | ORAL_TABLET | Freq: Two times a day (BID) | ORAL | Status: AC

## 2015-04-25 NOTE — Telephone Encounter (Signed)
LMOM via language line. Medication sent to pharmacy.

## 2015-04-25 NOTE — Telephone Encounter (Signed)
-----   Message from Fernanda Drum, FNP sent at 04/25/2015  8:16 AM EST ----- Please notify patient via Spanish interpreter that his urine culture was positive for infection. I would like him to start a prescription for Bactrim twice daily 7 days. He can follow-up with his urologist in [place] to confirm resolution of infection in a few weeks. Thanks

## 2015-04-26 NOTE — Telephone Encounter (Signed)
LMOM

## 2015-04-30 ENCOUNTER — Emergency Department
Admission: EM | Admit: 2015-04-30 | Discharge: 2015-04-30 | Disposition: A | Discharge status: Home / Self Care | Payer: Medicare Other | Attending: Emergency Medicine | Admitting: Emergency Medicine

## 2015-04-30 ENCOUNTER — Emergency Department: Payer: Medicare Other

## 2015-04-30 DIAGNOSIS — Z791 Long term (current) use of non-steroidal anti-inflammatories (NSAID): Secondary | ICD-10-CM | POA: Insufficient documentation

## 2015-04-30 DIAGNOSIS — N390 Urinary tract infection, site not specified: Secondary | ICD-10-CM | POA: Insufficient documentation

## 2015-04-30 DIAGNOSIS — K5901 Slow transit constipation: Secondary | ICD-10-CM | POA: Insufficient documentation

## 2015-04-30 DIAGNOSIS — Z79899 Other long term (current) drug therapy: Secondary | ICD-10-CM | POA: Insufficient documentation

## 2015-04-30 DIAGNOSIS — M545 Low back pain: Secondary | ICD-10-CM | POA: Diagnosis present

## 2015-04-30 DIAGNOSIS — M5432 Sciatica, left side: Secondary | ICD-10-CM | POA: Diagnosis not present

## 2015-04-30 DIAGNOSIS — Z792 Long term (current) use of antibiotics: Secondary | ICD-10-CM | POA: Diagnosis not present

## 2015-04-30 DIAGNOSIS — I10 Essential (primary) hypertension: Secondary | ICD-10-CM | POA: Insufficient documentation

## 2015-04-30 LAB — URINALYSIS COMPLETE WITH MICROSCOPIC (ARMC ONLY)
BILIRUBIN URINE: NEGATIVE
Glucose, UA: NEGATIVE mg/dL
HGB URINE DIPSTICK: NEGATIVE
KETONES UR: NEGATIVE mg/dL
Nitrite: NEGATIVE
PH: 5 (ref 5.0–8.0)
PROTEIN: NEGATIVE mg/dL
Specific Gravity, Urine: 1.012 (ref 1.005–1.030)
Squamous Epithelial / LPF: NONE SEEN

## 2015-04-30 MED ORDER — CEFIXIME 400 MG PO TABS: 400.0000 mg | ORAL_TABLET | Freq: Every day | ORAL | Status: AC

## 2015-04-30 MED ORDER — CEFUROXIME AXETIL 500 MG PO TABS: 500.0000 mg | ORAL_TABLET | Freq: Two times a day (BID) | ORAL | Status: DC

## 2015-04-30 MED ORDER — PREDNISONE 10 MG (21) PO TBPK: ORAL_TABLET | ORAL | Status: AC

## 2015-04-30 MED ORDER — ATENOLOL 25 MG PO TABS: 25.0000 mg | ORAL_TABLET | Freq: Two times a day (BID) | ORAL | Status: AC

## 2015-04-30 MED ORDER — CEFIXIME 400 MG PO TABS
400.0000 mg | ORAL_TABLET | Freq: Once | ORAL | Status: AC
  Administered 2015-04-30: 400 mg via ORAL
  Filled 2015-04-30: qty 1

## 2015-04-30 NOTE — ED Notes (Signed)
Pt was diagnosed with UTI on 2/20, currently taking antibiotics.  Pt c/o of burning with urination.

## 2015-04-30 NOTE — ED Provider Notes (Signed)
[facility] Emergency Department Provider Note  ____________________________________________  Time seen: Approximately 11:09 PM  I have reviewed the triage vital signs and the nursing notes.   HISTORY  Chief Complaint Back Pain    HPI Dillon Acosta is a 50 y.o. male with history of frequent UTIs followed by urologywho was given Septra last week for Escherichia coli infection. He has completed this. He is having dysuria began. He is also having low back pain with radiation down the left leg. He denies fevers and chills. He reports his bowels are sluggish. Occasional burning sensation into the left leg. He has a history of multiple surgeries of the left lower leg.   Past Medical History  Diagnosis Date  . Gout   . Hypertension   . BPH (benign prostatic hyperplasia)   . Chronic leg pain   . DDD (degenerative disc disease), lumbar     Patient Active Problem List   Diagnosis Date Noted  . Gout   . BPH (benign prostatic hyperplasia)   . Hypertension   . Chronic leg pain   . DDD (degenerative disc disease), lumbar     Past Surgical History  Procedure Laterality Date  . Leg surgery Left     Current Outpatient Rx  Name  Route  Sig  Dispense  Refill  . atenolol (TENORMIN) 25 MG tablet   Oral   Take 1 tablet (25 mg total) by mouth 2 (two) times daily.   60 tablet   0   . buPROPion (WELLBUTRIN SR) 150 MG 12 hr tablet   Oral   Take by mouth 2 (two) times daily.         . cefixime (SUPRAX) 400 MG tablet   Oral   Take 1 tablet (400 mg total) by mouth daily.   10 tablet   0   . naproxen (NAPROSYN) 500 MG tablet   Oral   Take 1 tablet (500 mg total) by mouth 2 (two) times daily with a meal.   30 tablet   0   . nitrofurantoin (MACRODANTIN) 100 MG capsule   Oral   Take 100 mg by mouth 2 (two) times daily.         . pantoprazole (PROTONIX) 40 MG tablet   Oral   Take 40 mg by mouth daily.         . predniSONE (STERAPRED UNI-PAK  21 TAB) 10 MG (21) TBPK tablet      6 tablets on day 1, 5 tablets on day 2, 4 tablets on day 3, etc...   21 tablet   0   . sulfamethoxazole-trimethoprim (BACTRIM DS,SEPTRA DS) 800-160 MG tablet   Oral   Take 1 tablet by mouth 2 (two) times daily.   14 tablet   0   . tamsulosin (FLOMAX) 0.4 MG CAPS capsule   Oral   Take 1 capsule (0.4 mg total) by mouth daily.   30 capsule   12   . temazepam (RESTORIL) 7.5 MG capsule   Oral   Take 7.5 mg by mouth at bedtime as needed for sleep.           Allergies Review of patient's allergies indicates no known allergies.  Family History  Problem Relation Age of Onset  . Prostate cancer Brother     Social History Social History  Substance Use Topics  . Smoking status: Never Smoker   . Smokeless tobacco: None  . Alcohol Use: No    Review of Systems Constitutional:  No fever/chills Eyes: No visual changes. ENT: No sore throat. Cardiovascular: Denies chest pain. Respiratory: Denies shortness of breath. Gastrointestinal: No abdominal pain.  No nausea, no vomiting.  No diarrhea.  No constipation. Genitourinary: per HPI Musculoskeletal: per HPI Skin: Negative for rash. Neurological: Negative for headaches, focal weakness or numbness. 10-point ROS otherwise negative.  ____________________________________________   PHYSICAL EXAM:  VITAL SIGNS: ED Triage Vitals  Enc Vitals Group     BP 04/30/15 2135 138/86 mmHg     Pulse Rate 04/30/15 2135 71     Resp 04/30/15 2135 18     Temp 04/30/15 2135 97.9 F (36.6 C)     Temp Source 04/30/15 2135 Oral     SpO2 04/30/15 2135 96 %     Weight 04/30/15 2135 190 lb (86.183 kg)     Height 04/30/15 2135  (1.676 m)     Head Cir --      Peak Flow --      Pain Score 04/30/15 2151 7     Pain Loc --      Pain Edu? --      Excl. in GC? --     Constitutional: Alert and oriented. Well appearing and in no acute distress. Eyes: Conjunctivae are normal.  Cardiovascular: Normal rate,  regular rhythm. Grossly normal heart sounds.  Good peripheral circulation. Respiratory: Normal respiratory effort.  No retractions. Lungs CTAB. Gastrointestinal: Soft and nontender. No distention. No abdominal bruits. No CVA tenderness. Musculoskeletal:   Non tender over the lumbar spine and paraspinal muscles.  rom intact.  Neg SLR bilateral. nontender over the greater trochanter bilateral.  Neurologic:  Normal speech and language. No gross focal neurologic deficits are appreciated. No gait instability. Skin:  Skin is warm, dry and intact. No rash noted. Psychiatric: Mood and affect are normal. Speech and behavior are normal.  ____________________________________________   LABS (all labs ordered are listed, but only abnormal results are displayed)  Labs Reviewed  URINALYSIS COMPLETEWITH MICROSCOPIC ([facility] ONLY) - Abnormal; Notable for the following:    Color, Urine YELLOW (*)    APPearance CLEAR (*)    Leukocytes, UA TRACE (*)    Bacteria, UA MANY (*)    All other components within normal limits  URINE CULTURE   ____________________________________________  EKG    ____________________________________________  RADIOLOGY  CLINICAL DATA: Patient with radiating lower back pain for 2 months.  EXAM: LUMBAR SPINE - 2-3 VIEW  COMPARISON: Abdominal radiograph 04/18/2015  FINDINGS: Normal anatomic alignment. No evidence for acute fracture or dislocation. L2-3 and L5-S1 degenerative disc disease. L4-5 and L5-S1 facet degenerative changes. Preservation of the vertebral body heights. SI joints are unremarkable.  IMPRESSION: No acute osseous abnormality.  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   Electronically Signed  By: Annia Belt M.D.  On: 04/30/2015 22:57  ____________________________________________   PROCEDURES  Procedure(s) performed: None  Critical Care performed: No  ____________________________________________   INITIAL  IMPRESSION / ASSESSMENT AND PLAN / ED COURSE  Pertinent labs & imaging results that were available during my care of the patient were reviewed by me and considered in my medical decision making (see chart for details).  50 year old male who presents with dysuria and positive urinalysis. He completed a course of Septra about a week ago. His last culture showed Escherichia coli sensitive to Suprax. He is given a 10 day course. He also has low back pain with radicular symptoms. He is given a prednisone taper for sciatica. He is encouraged to follow-up  with a primary physician for further evaluation. He has known degenerative disc disease. X-ray of lumbar spine as above. He will return to the emergency room for any worsening symptoms. He is also given 1 refill on his blood pressure medicine atenolol 25 mg twice a day. He is trying to establish with a primary physician. ____________________________________________   FINAL CLINICAL IMPRESSION(S) / ED DIAGNOSES  Final diagnoses:  UTI (lower urinary tract infection)  Sciatica of left side  Slow transit constipation      Ignacia Bayley, PA-C 04/30/15 2348  Minna Antis, MD 05/01/15 0005

## 2015-04-30 NOTE — Discharge Instructions (Signed)
Ejercicios para la espalda (Back Exercises) Los siguientes ejercicios fortalecen los msculos que dan soporte a la espalda y, [place], ayudan a Theatre manager la flexibilidad de la zona lumbar. Hacer estos ejercicios puede ser de ayuda para Information systems manager de espalda o Best boy actual. Si tiene dolor o molestias en la espalda, intente hacer estos ejercicios 2 o 3veces por da, o como se lo haya indicado el mdico. Cuando el dolor desaparezca, hgalos una vez por da, pero haga ms repeticiones de cada ejercicio. Si no tiene dolor o [employer] espalda, haga estos ejercicios una vez por da o como se lo haya indicado el mdico. EJERCICIOS Rodilla al pecho Repita estos pasos 3 o 5veces con cada pierna:  Acustese boca arriba sobre una cama dura o sobre el suelo con las piernas extendidas.  Lleve una rodilla al pecho. La otra pierna debe quedar extendida y en contacto con el suelo.  Mantenga la rodilla contra el pecho. Para lograrlo tmese la rodilla o el muslo.  Tire de la rodilla hasta sentir una elongacin suave en la parte baja de la espalda.  Mantenga la elongacin durante 10 a 30segundos.  Suelte y extienda la pierna lentamente. Inclinacin de la pelvis Repita estos pasos 5 o 10veces:  Acustese boca arriba sobre una cama dura o sobre el suelo con las piernas extendidas.  Flexione las rodillas de modo que apunten al techo y los pies queden apoyados en el suelo.  Contraiga los msculos de la parte baja del abdomen para empujar la zona lumbar contra el suelo. Con este movimiento se inclinar la pelvis de modo que el cccix apunte hacia el techo, en lugar de apuntar en direccin a los pies o al suelo.  Contraiga suavemente y respire con normalidad mientras mantiene esta posicin durante 5 a 10segundos. El perro y el gato Repita estos pasos hasta que la zona lumbar se vuelva ms flexible:  [place] manos y las rodillas sobre una superficie firme. Las manos  deben estar alineadas con los hombros y las rodillas con las caderas. Puede colocarse almohadillas debajo de las rodillas para estar cmodo.  Deje caer la cabeza y baje el cccix en direccin al suelo de modo que la zona lumbar se arquee como el lomo de un [place].  Mantenga esta posicin durante 5segundos.  Lentamente, levante la cabeza y eleve el cccix de modo que apunte en direccin al techo para que la espalda forme un arco hundido como el lomo de un perro contento.  Mantenga esta posicin durante 5segundos. Flexiones de English as a second language teacher pasos 5 o 10veces:  Acustese boca abajo en el suelo.  [place] manos cerca de la cabeza, separadas aproximadamente al ancho de los hombros.  Con la espalda lo ms relajada posible y las caderas apoyadas en el suelo, extienda lentamente los brazos para levantar la mitad superior del cuerpo y Community education officer los hombros. No use los msculos de la espalda para elevar la parte superior del torso. Puede cambiar las manos de lugar para estar ms cmodo.  Mantenga esta posicin durante 5segundos mientras mantiene la espalda relajada.  Lentamente vuelva a la posicin horizontal. Puentes Repita estos pasos 10veces:  Acustese boca arriba sobre una superficie firme.  Flexione las rodillas de modo que apunten al techo y los pies queden apoyados en el suelo.  Contraiga los glteos y despegue las nalgas del suelo hasta que la cintura est casi a la misma altura que las rodillas. Debe  sentir el trabajo muscular en las nalgas y la parte posterior de los muslos. Si no siente el esfuerzo de [employer], aleje los pies 1 o 2pulgadas (2,5 o 5centmetros) de las nalgas.  Mantenga esta posicin durante 3 a 5segundos.  Baje lentamente las caderas a la posicin inicial y relaje los glteos por completo. Si este ejercicio le resulta muy fcil, intente realizarlo con los brazos cruzados [place]. Abdominales Repita estos pasos 5 o  10veces:  Acustese boca arriba sobre una cama dura o sobre el suelo con las piernas extendidas.  Flexione las rodillas de modo que apunten al techo y los pies queden apoyados en el suelo.  Cruce los [employer].  Baje levemente el mentn en direccin al pecho sin doblar el cuello.  Contraiga los msculos del abdomen y con lentitud eleve el torso lo suficiente como para Artist los omplatos del suelo. No eleve el torso ms alto que eso, porque esto puede sobreexigir a la zona lumbar y no ayuda a Investment banker, operational.  Regrese lentamente a la posicin inicial. Elevaciones de espalda Repita estos pasos 5 o 10veces:  Acustese boca abajo con los brazos a los costados del cuerpo y apoye la frente en el suelo.  Contraiga los msculos de las piernas y los glteos.  Lentamente despegue el pecho del suelo mientras mantiene las caderas bien apoyadas en el suelo. Mantenga la nuca alineada con la curvatura de la espalda. Los ojos deben mirar al suelo.  Mantenga esta posicin durante 3 a 5segundos.  Regrese lentamente a la posicin inicial. SOLICITE ATENCIN MDICA SI:  El dolor o las molestias en la espalda se vuelven mucho ms intensos cuando hace un ejercicio.  El dolor o las molestias en la espalda no se Copy en el trmino de las 2horas posteriores a Copy. Si tiene alguno de [employer], deje de [employer] ejercicios de inmediato. No vuelva a hacer los ejercicios a menos que el mdico lo autorice. SOLICITE ATENCIN MDICA DE INMEDIATO SI:  Siente un dolor sbito e intenso en la espalda. Si esto ocurre, deje de [employer] ejercicios de inmediato. No vuelva a hacer los ejercicios a menos que el mdico lo autorice.   Esta informacin no tiene Theme park manager el consejo del mdico. Asegrese de hacerle al mdico cualquier pregunta que tenga.   Document Released: 02/19/2005 Document Revised: 11/10/2014 Elsevier Interactive  Patient Education 2016 [employer].  Estreimiento - Adultos (Constipation, Adult) Se llama constipacin cuando:  Elimina heces (defeca) menos de 3 veces por semana.  Tiene dificultad para defecar.  Las heces son secas y duras o son ms grandes que lo normal. CUIDADOS EN EL HOGAR   Consuma alimentos con alto contenido de [place]. Por ejemplo, frutas, vegetales, porotos y cereales integrales, como el arroz integral.  Evite los alimentos ricos en grasas y International aid/development worker. Estos incluyen patatas fritas, hamburguesas, galletas, dulces y refrescos.  Si no consume suficientes alimentos ricos en fibras, tome productos que tengan agregado de fibra (suplementos).  Beba suficiente lquido para mantener el pis (orina) claro o de color amarillo plido.  Haga ejercicio en forma regular, o como lo indique su mdico.  Vaya al bao cuando sienta la necesidad de Advertising copywriter. No se aguante las ganas.  Solo tome los medicamentos que le haya indicado su mdico. No tome medicamentos que le ayuden a Advertising copywriter (laxantes) sin antes consultarlo con su mdico. SOLICITE AYUDA DE INMEDIATO SI:   Observa sangre brillante en las  heces (materia fecal).  El estreimiento dura ms de 4 das o [place].  Tiene dolor en el vientre (abdominal) o el trasero (recto).  Las heces son delgadas (como un lpiz).  Pierde peso de [place] inexplicable. ASEGRESE DE QUE:   Comprende estas instrucciones.  Controlar su afeccin.  Recibir ayuda de inmediato si no mejora o si empeora.   Esta informacin no tiene Theme park manager el consejo del mdico. Asegrese de hacerle al mdico cualquier pregunta que tenga.   Document Released: 03/24/2010 Document Revised: 03/12/2014 Elsevier Interactive Patient Education 2016 [employer].  [employer] con rehabilitacin (Sciatica With Rehab) El nervio citico va desde la regin inferior de la espalda hacia la pierna y es el responsable de la sensibilidad y el control de los msculos de la  parte de atrs (posterior) del muslo, pierna y pie. La citica es una enfermedad caracterizada por una inflamacin en este nervio.  SNTOMAS  Signos de dao al nervio, incluso adormecimiento o debilidad en el lado posterior de las extremidades bajas.  Dolor en la parte posterior del muslo que podra bajar hacia la pierna.  Dolor que [employer] Corporation al estar sentado durante largos perodos de [place].  Algunas veces, sensibilidad en las nalgas. CAUSAS La causa de la citica es la inflamacin de los nervios citicos. La inflamacin se debe a que algo irrita el nervio. Entre las causas de la irritacin se encuentran:  Estar sentado durante largos perodos.  Traumatismos directos al nervio.  Artritis de Landscape architect.  Hernia o ruptura de disco.  Deslizamiento de Blenda Bridegroom (espondilolistesis).  Presin de los tejidos blandos, como msculos o el tejidos tipo ligamento (fascia). LOS RIESGOS AUMENTAN CON:  Deportes en los que se presiona la columna (ftbol americano o levantamiento de pesas).  Poca fuerza y flexibilidad.  No hacer un precalentamiento adecuado.  Historia familiar de dolor de cintura o trastornos en discos.  Lesiones o cirugas previas en la espalda.  Mecnica incorrecta del cuerpo, en especial al levantar, o mala postura. PREVENCIN  Precalentamiento adecuado y elongacin antes de la [place].  Mantener la forma fsica:  [place], flexibilidad y resistencia muscular.  Capacidad cardiovascular.  Conozca y use tcnicas adecuadas, especialmente en posturas y levantamiento. Cuando sea posible, tener un entrenador que corrija la Administrator.  Evite las actividades que tensionen constantemente la columna. PRONSTICO Si se trata adecuadamente, generalmente es curable dentro de las 6 semanas. En ocasiones requiere someterse a [place].  COMPLICACIONES RELACIONADAS  Dao permanente que incluye dolor, adormecimiento, hormigueo o debilidad.  Dolor crnico en la  espalda.  Riesgos de la ciruga: infecciones, hemorragias, dao en los nervios, o daos a los tejidos circundantes. TRATAMIENTO El tratamiento inicial incluye interrumpir las actividades que agravan los sntomas. Se incluye el uso de medicamentos y la aplicacin de hielo para reducir Chief Technology Officer y la inflamacin. Los ejercicios de elongacin y fortalecimiento pueden ayudar a reducir Chief Technology Officer con la [place]. Los ejercicios pueden Management consultant o con un terapeuta. Un terapeuta podr recomendarle otros tratamientos, como estimulacin nerviosa electrnica transcutnea (TENS) o ultrasonido. En algunos casos se indica una inyeccin de corticoides para reducir la inflamacin del nervio citico. Si los sntomas persisten por ms de 6 meses de tratamiento no quirrgico (conservador), se Public relations account executive. MEDICAMENTOS   Si necesita analgsicos, se recomiendan los antiinflamatorios no esteroides, como aspirina e ibuprofeno y otros calmantes menores, como acetaminofeno.  No tome medicamentos para el dolor dentro de los [address] previos a la [place].  Los analgsicos  prescriptos se indicarn si el mdico lo considera necesario. Utilcelos como se le indique y slo cuando lo necesite.  Los ungentos pueden ser beneficiosos.  En algunos casos se indica una inyeccin de corticosteroides. Estas inyecciones deben reservarse para los [place] graves, porque slo se pueden administrar una determinada cantidad de veces. CALOR Y FRO   El tratamiento con fro [employer] y reduce la inflamacin. El fro debe aplicarse durante 10 a 15 minutos cada 2  3 horas para reducir la inflamacin y Chief Technology Officer e inmediatamente despus de cualquier actividad que agrava los sntomas. Utilice bolsas de hielo o masajee la zona con un trozo de hielo (masaje de hielo).  El calor puede usarse antes de Therapist, music y de las actividades de fortalecimiento indicadas por el profesional, le fisioterapeuta o Orthoptist. Utilice una  bolsa trmica o sumerja la lesin en agua caliente. SOLICITE ATENCIN MDICA SI:  El tratamiento parece no ofrecer beneficios, o el trastorno [employer].  Los medicamentos producen efectos secundarios. EJERCICIOS EJERCICIOS DE AMPLITUD DE MOVIMIENTOS Y ELONGACIN Citica La mayora de las personas con dolor de citica encuentran que sus sntomas empeoran con el delantero excesiva flexin (flexin) o arco en la espalda baja (extensin). Los ejercicios que le ayudarn a Oncologist sus sntomas se Research scientist (life sciences). El mdico, fisioterapeuta o Furniture conservator/restorer a Chief Strategy Officer qu ejercicios sern de ayuda para resolver su dolor de espalda. No realice ningn ejercicio sin consultarlo antes con el profesional. Discontinue los ejercicios que empeoran sus sntomas, hasta que hable con el mdico. Si siente dolor, entumecimiento u hormigueo que [employer] glteos, piernas o pies, el objetivo de esta terapia es que estos sntomas se acerquen a la espalda y Customer service manager. En ocasiones, los sntomas de la pierna mejorarn, Development worker, international aid en la espalda puede empeorar; esto es un indicio tpico del progreso en la rehabilitacin. Asegrese de que estar atento a cualquier cambio en sus sntomas y las actividades que ha [employer]	[place] 24 horas antes del cambio. Compartir esta informacin con su mdico le permitir un mejor tratamiento para tratar su enfermedad. Estos ejercicios le ayudarn en la recuperacin de la lesin. Los sntomas podrn aliviarse con o sin asistencia adicional de su mdico, fisioterapeuta o Herbalist. Al completar estos ejercicios, recuerde:   Restaurar la flexibilidad del tejido ayuda a que las articulaciones recuperen el movimiento normal. Esto permite que el movimiento y la actividad sea ms saludables y menos dolorosos.  Para que sea efectiva, cada elongacin debe realizarse durante al menos 30 segundos.  La elongacin nunca debe ser dolorosa. Deber sentir  slo un alargamiento o distensin suave del tejido que estira. EJERCICIOS DE AMPLITUD DE MOVIMIENTOS Y ELONGACIN: ELONGACION Flexin - una rodilla al pecho  Recustese en una cama dura o sobre el piso, con ambas piernas extendidas al frente.  Manteniendo una pierna en contacto con el piso, lleve la rodilla opuesta al pecho. Mantenga la pierna en esa posicin, sostenindola por la zona posterior del muslo o por la rodilla.  Presione hasta sentir un suave estiramiento en la cintura. Mantenga esta posicin durante __________ segundos.  Libere la pierna lentamente y repita el ejercicio con el lado opuesto. Reptalo __________ veces. Realice este estiramiento __________ Anthoney Harada por da.  ELONGACIN Flexin, dos rodillas al pecho   Recustese en una cama dura o sobre el piso, con ambas piernas extendidas al frente.  Manteniendo una pierna en contacto con el piso, lleve la rodilla opuesta al pecho.  Tense los msculos  del estmago para apoyar la espalda y levante la otra rodilla [place]. Mantenga las piernas en su lugar y tmese por detrs de las caderas o las rodillas.  Con ambas rodillas en el pecho, tire hasta que sienta un estiramiento en la parte trasera de la espalda. Mantenga esta posicin durante __________ segundos.  Tense los msculos del estmago y baje las piernas de a una por vez. Reptalo __________ veces. Realice este estiramiento __________ Anthoney Harada por da.  ELONGACIN Rotacin de la zona baja del tronco.  Recustese sobre una cama firme o sobre el suelo. Mantenga las piernas al frente, doble las rodillas de modo que ambas apunten hacia el techo y los pies queden bien apoyados en el piso.  Extienda los brazos a Dance movement psychotherapist. Esto estabilizar la zona superior del cuerpo, manteniendo los hombros en contacto con el piso.  Con cuidado y lentamente deje caer ambas rodillas juntas hacia un lado, hasta que sienta un suave estiramiento en la espalda baja. Mantenga esta posicin  durante __________ segundos.  Tense los msculos del estmago para apoyar la espalda y lleve las rodillas a la posicin inicial. Repita el ejercicio hacia el otro lado. Reptalo __________ veces. Realice este ejercicio __________ veces por da. EJERCICIOS DE AMPLITUD DE MOVIMIENTOS Y FLEXIBILIDAD: ELONGACIN Extensin posicin prona sobre los codos  Acustese sobre el estmago sobre el piso, una cama ser muy blanda. Coloque las palmas a una distancia igual al ancho de los hombres y a la altura de la cabeza.  Coloque los codos bajo los hombros. Si siente dolor, colquese almohadas debajo del pecho.  Deje que su cuerpo se relaje, de modo que las caderas queden ms abajo y tengan ms contacto con el piso.  Mantenga esta posicin durante __________ segundos.  Vuelva lentamente a la posicin plana sobre el piso. Reptalo __________ veces. Realice este estiramiento __________ Anthoney Harada por da.  AMPLITUD DE MOVIMIENTOS - Extensin - flexin de brazos en posicin prona  Acustese sobre el [employer]	[place] piso, una cama ser [place]. Coloque las palmas a una distancia igual al ancho de los hombres y a la altura de la cabeza.  Mantenga la espalda tan relajada como pueda, enderece lentamente los codos [address] las caderas contra el suelo. Puede modificar la posicin de las manos para estar ms cmodo. A medida que gana movimiento, sus manos quedarn ms por debajo de los hombros.  Mantenga cada posicin durante __________ segundos.  Vuelva lentamente a la posicin plana sobre el piso. Reptalo __________ veces. Realice este estiramiento __________ Anthoney Harada por da.  EJERCICIOS DE FORTALECIMIENTO - Citica Estos ejercicios le ayudarn en la recuperacin de la lesin. Estos ejercicios deben hacerse cerca de su "punto dulce". Este es el arco neutro, de la parte baja de la espalda, en algn lugar entre la posicin completamente redondeada y arqueada plenamente, que es la posicin menos  dolorosa. Cuando se realiza en [employer] de seguridad del movimiento, estos ejercicios se pueden Chemical engineer para las personas que tienen una lesin basada en flexin o extensin. Con estos ejercicios, los sntomas podrn desaparecer con o sin mayor intervencin del profesional, el fisioterapeuta o Orthoptist. Al completar estos ejercicios, recuerde:   Los msculos pueden ganar tanto la resistencia como la fuerza que necesita para sus actividades diarias a travs de ejercicios controlados.  Realice los ejercicios como se lo indic el mdico, el fisioterapeuta o Orthoptist. [employer] slo con los ejercicios de resistencia y haga las repeticiones que su mdico le indique.  Podr  experimentar dolor o cansancio muscular, pero el dolor o molestia que trata de eliminar a travs de los ejercicios nunca debe empeorar. Si el dolor empeora, detngase y asegrese de que est siguiendo las directivas correctamente. Si an siente dolor luego de Education officer, environmental lo ajustes necesarios, deber discontinuar el ejercicio hasta que pueda conversar con el profesional sobre el problema. FORTALECIMIENTO - Abdominales profundos - Inclinacin plvica  Recustese sobre una cama firme o sobre el suelo. Mantenga las piernas al frente, doble las rodillas de modo que ambas apunten hacia el techo y los pies queden bien apoyados en el piso.  Tensione la zona baja de los msculos abdominales para presionar la Oncologist. Este movimiento har rotar su pelvis de modo que el cccix quede hacia arriba y no apuntando a los pies o hacia el piso.  Con una tensin suave y respiracin pareja, mantenga esta posicin durante __________ segundos. Reptalo __________ veces. Realice este estiramiento __________ Anthoney Harada por da.  FORTALECIMIENTO - Abdominales encogimiento abdominal  Recustese sobre una cama firme o sobre el suelo. Mantenga las piernas al frente, doble las rodillas de modo que ambas apunten hacia el techo y los pies queden  bien apoyados en el piso. Cruce las [employer].  Apunte suavemente con la barbilla hacia abajo, sin doblar el cuello.  Tensione los abdominales y eleve lentamente el tronco la altura suficiente para despegar los omplatos. Si se eleva ms, pondr tensin excesiva en la cintura y esto no fortalecer ms los abdominales.  Controle la vuelta a la posicin inicial. Reptalo __________ veces. Realice este estiramiento __________ Anthoney Harada por da.  FORTALECIMIENTO - En cuadrpedo, elevacin de miembro superior e inferior opuestos  [employer] y las rodillas en una superficie firme. Las manos deben quedar a la altura de los hombros y las rodillas debajo de las caderas. Puede colocar algo debajo las rodillas para estar ms cmodo.  Encuentre la posicin neutral de la columna vertebral y Public house manager los msculos abdominales de modo que pueda mantener esta posicin. Los hombros y las caderas deben formar un rectngulo paralelo con el suelo y recto.  Manteniendo el tronco firme, eleve la mano derecha a la altura del hombro y luego eleve la pierna izquierda a la altura de la cadera. Asegrese de no contener la respiracin. Mantenga cada posicin durante __________ segundos.  Con los msculos abdominales en tensin y la espalda firme, vuelva lentamente a la posicin inicial. Repita con el otro brazo y la otra pierna. Reptalo __________ veces. Realice este estiramiento __________ Anthoney Harada por da.  FUERZA Abdominales y cudriceps - Lexicographer las piernas rectas  Recustese en una cama dura o sobre el piso, con ambas piernas extendidas al frente.  Deje una pierna en contacto con el suelo y doble la otra rodilla de manera que el pie quede contra el suelo.  Encuentre la posicin neutral de la columna vertebral y Public house manager los msculos abdominales de modo que pueda mantener esta posicin.  Levante lentamente la pierna del suelo una 6 pulgadas y cuente [place] 15, asegrese de no  contener la respiracin.  Mantega la columna en posicin neutral, y baje lentamente la pierna hasta el suelo. Repita el ejercicio con cada pierna __________ veces. Realice este estiramiento __________ Anthoney Harada por da. CONSIDERACIONES ACERCA DE LA POSTURA Y LA MECNICA DEL CUERPO Citica Si mantiene una postura correcta cuando se encuentre de pie, sentado o realizando sus actividades, reducir el [employer] tejidos del cuerpo, y Acupuncturist a los tejidos  lesionados la posibilidad de curarse y Film/video editor las experiencias dolorosas. A continuacin se indican pautas generales para mejorar la postura- Su mdico o fisioterapeuta le dar instrucciones especficas segn sus necesidades. Al leer estas pautas recuerde:  Los ejercicios indicados por su mdico lo ayudarn a Chartered certified accountant flexibilidad y la fuerza para Pharmacologist las posturas correctas.  Una postura correcta le proporciona a sus articulaciones el medio ptimo para funcionar bien. Las articulaciones se desgastan menos cuando estn sostenidas adecuadamente por una columna vertebral en buena postura. Esto significa que su cuerpo estar ms sano y Research officer, trade union.  La correcta postura debe practicarse en todas las actividades, especialmente al estar sentado o de pie durante [place]. Tambin es importante al realizar actividades repetitivas de bajo estrs (tipeo) o una actividad nica y pesada. POSICIONES DE Dellie Catholic Tenga en cuenta cules son las posturas que ms dolor le causan al elegir una posicin de descanso. Si siente dolor con las actividades en que deba realizar una flexin (sentarse, inclinarse, detenerse, ponerse en cuclillas), elija una posicin que le permita descansar en una postura menos flexionada. Evite curvarse en posicin fetal cuando se encuentre de lado. Si el dolor empeora con las actividades basadas en la extensin (estar de pie durante un tiempo prolongado, trabajar con las manos por arriba de la cabeza) evite descansar en [place]omia Dear  posicin extendida durante mucho tiempo, como dormir sobre el [place]. La [employer] de las Psychologist, forensic cmodo el descanso sobre la columna vertebral en una posicin neutral, ni muy redondeada ni [place]. Recustese sobre su lado en una cama que no est hundida con una almohada entre las rodillas o sobre la espalda con una almohada bajo las rodillas, y sentir [place]. Tenga en cuenta que cualquier posicin en [employer], no importa si es una postura [place], puede provocarle rigidez. POSTURAS CORRECTAS PARA SENTARSE Con el fin de minimizar el estrs y Environmental health practitioner en su columna, deber sentarse con la postura correcta. Esto le ayudar a que el cuerpo est ms sano. Recuperar una buena postura es un proceso gradual. Muchas personas pueden trabajar ms cmodas mediante el uso de diferentes soportes hasta que tengan la flexibilidad y la fuerza para mantener esta postura por su cuenta. Al sentarse con la [employer], los odos deben estar sobre los hombros y los hombros sobre las caderas. Debe utilizar el respaldo de la silla para apoyar la espalda. La espalda estar en una posicin neutral, ligeramente arqueada. Puede colocar una pequea almohada o toalla doblada en la base de la espalda baja para apoyarse.  Si trabaja en un escritorio, cree un ambiente que le proporciones un buen soporte y [place]. Sin soporte extra, los msculos se fatigan y causan tensin excesiva en las articulaciones y otros tejidos. Tenga en cuenta estas recomendaciones: SILLA:   La silla debe poder deslizarse por debajo del escritorio cuando su espalda tome contacto con el respaldo. Esto le permitir trabajar ms cerca.  La altura de la silla debe permitirle que los ojos tengan el nivel de la parte superior del monitor y las manos estn ms abajo que los codos. POSICIN DEL CUERPO  Los pies deben tener contacto con el piso. Si no es posible, use un posapies.  Mantenga las [employer]  hombros. Esto reducir el estrs en el cuello y en la cintura. POSTURAS INCORRECTAS PARA SENTARSE  Si se siente cansado e incapaz de asumir una postura sentada sana, no se encorve ni se hunda. Esto pone una tensin excesiva en los tejidos  de su espalda, y causa ms dao y Engineer, mining.Entre las opciones ms saludables se incluyen:  El uso de ms apoyo, como una almohada lumbar.  Cambio de tareas, a algo que demande una posicin vertical o caminar.  Tomar una breve caminata.  Recostarse y Theatre stage manager posicin neutral. DE PIE DURANTE UN TIEMPO PROLONGADO E INCLINADO LIGERAMENTE HACIA ADELANTE Cuando deba realizar una tarea que requiera inclinacin hacia adelante estando de pie en el mismo sitio durante mucho tiempo, coloque un pie en un objeto de 2 a 4 pulgadas de alto, para [employer]. Cuando ambos pies estn en el piso, la zona inferior de la espalda tiene a perder su ligera curvatura hacia adentro. Si esta curva se aplana (o se pronuncia demasiado) la espalda y las articulaciones experimentarn demasiado estrs, se fatigarn ms rpidamente y Geophysicist/field seismologist.  POSTURAS CORRECTAS PARA ESTAR DE PIE Una postura adecuada de pie realizarse en todas las actividades diarias, incluso si slo toman un momento, como al [employer]. Como en la postura de sentado, los odos deben estar sobre los hombros y los hombros sobre las caderas. Deber mantener una ligera tensin en sus msculos abdominales para asegurar la columna vertebral. El cccix debe apuntar hacia el suelo, no detrs de su cuerpo, por que resulta en una curvatura de la espalda sobre-extendida.  POSTURAS INCORRECTAS PARA ESTAR DE PIE Las posturas incorrectas para estar de pie incluyen tener la cabeza hacia delante, las rodillas bloqueadas o una excesiva curvatura de la espalda. CAMINAR [place] en [employer] erguida. Las [place], hombros y caderas deben estar alineados. ACTIVIDAD PROLONGADA EN UNA POSICIN FLEXIONADA Al  completar una tarea que requiere que se doble la cintura hacia adelante o inclinarse sobre una superficie baja, trate de encontrar una manera de estabilizar 3 de cada 4 de sus miembros. Puede colocar una mano o el codo en el [place], o descansar una rodilla en la superficie en la que est apoyado. Esto le proporcionar ms estabilidad para que sus msculos no se cansen tan rpidamente. El [employer] rodillas [place], o ligeramente dobladas, tambin reducir el estrs en la espalda baja. TCNICAS CORRECTAS PARA LEVANTAR OBJETOS SI:   Asumir una postura amplia. Esto le proporcionar ms estabilidad y la oportunidad de acercarse lo ms posible al objeto que se est levantando.  Tense los abdominales para asegurar la columna vertebral; luego flexione rodillas y caderas. Manteniendo la espalda en una posicin neutral, haga el esfuerzo con los msculos de la pierna. Levntese con las piernas, manteniendo la espalda derecha.  Pruebe el peso de los objetos desconocidos antes de tratar de Secondary school teacher.  Trate de [employer] codos hacia abajo y a los lados, con el fin de obtener la fuerza de los hombros al llevar un objeto.  Siempre pida ayuda a otra persona cuando deba levantar objetos pesados o incmodos TCNICAS INCORRECTAS PARA LEVANTAR OBJETOS NO:   Bloquee rodillas al levantar, aunque sea un objeto pequeo.  Se doble ni gire. Gire sobre los pies ni los mueva cuando necesite cambiar de direccin.  Tome conciencia que no puede levantar con seguridad ni un clip de papel, sin Clinical biochemist.   Esta informacin no tiene Theme park manager el consejo del mdico. Asegrese de hacerle al mdico cualquier pregunta que tenga.   Document Released: 02/07/2009 Document Revised: 07/06/2014 Elsevier Interactive Patient Education [employer]   Take antibiotic and pain medicine as directed. Try to establish with a regular doctor for further evaluation. Return to emergency room for  any  concerns. -tome antibiotico y [place] para el dolor como se indica.  Vea a un medico para mas evaluacion.  Regrese a urgencias si hay mas preocupaciones.

## 2015-04-30 NOTE — ED Notes (Signed)
Pt is a Insurance claims handler. Pt was asked and declined interpreter. Pt's wife is interpreting for pt per pt request. Pt c/o lower back pain radiating to both legs X 2 months. Pt describes his pain as cramping/ burning pain rated as a 7 that starts in the lower back and radiates down both legs.

## 2015-05-02 LAB — URINE CULTURE

## 2015-05-03 NOTE — Telephone Encounter (Signed)
LMOM- will send a letter.  

## 2017-01-22 IMAGING — US US RENAL
1 series · 14 of 25 positions shown · non-contrast
Comparison: None.

CLINICAL DATA: Urinary tract infection.  Kidney stones.

EXAM:
RENAL / URINARY TRACT ULTRASOUND COMPLETE

[Series 1: us renal · 0.23mm/px · 14 of 35 slices shown]
[im 1/35]
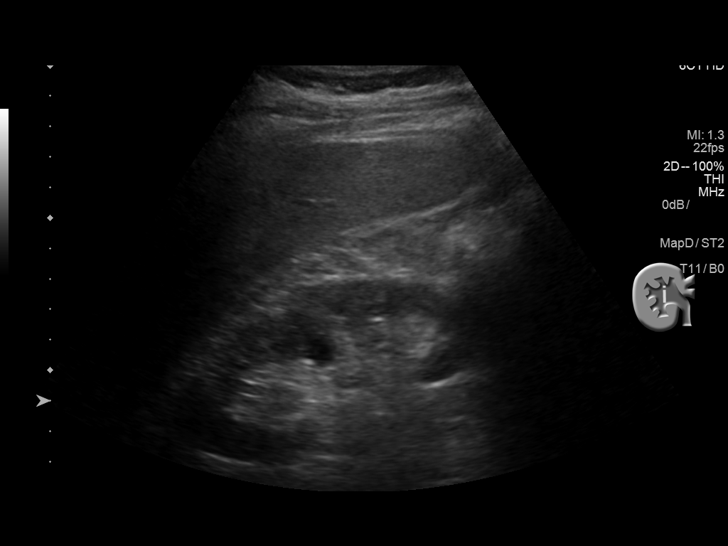
[im 3/35]
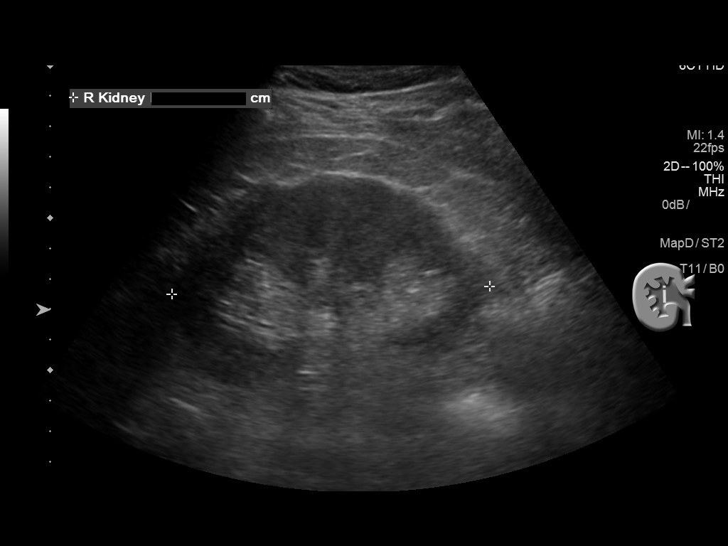
[im 6/35]
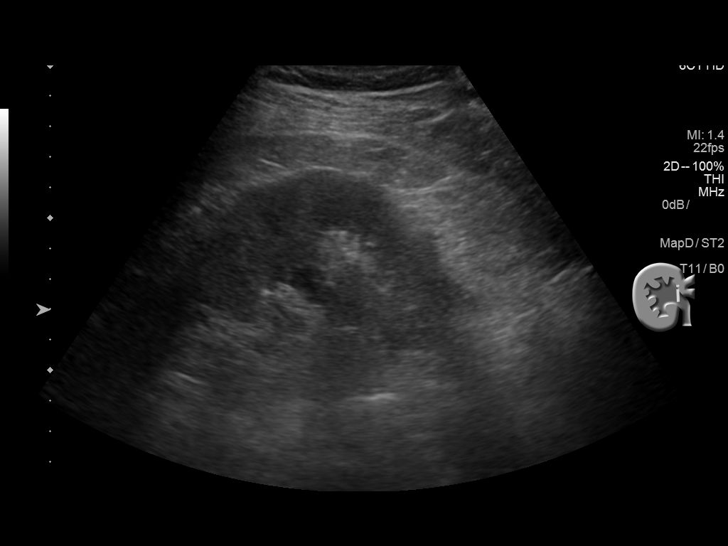
[im 9/35]
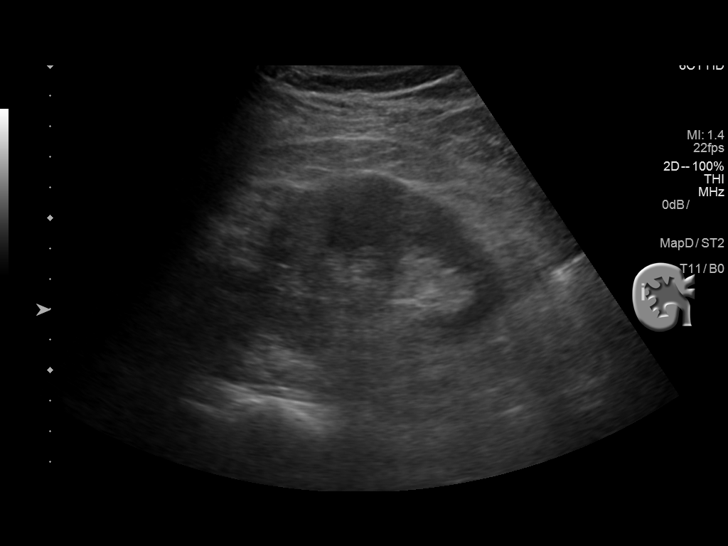
[im 12/35]
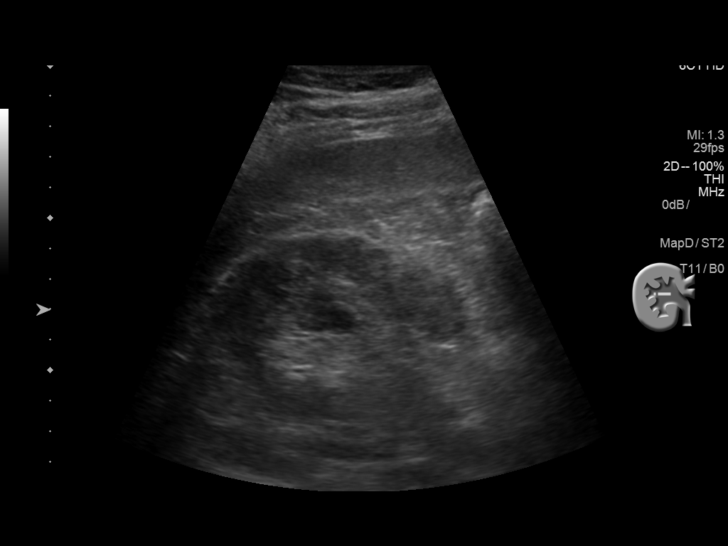
[im 13/35]
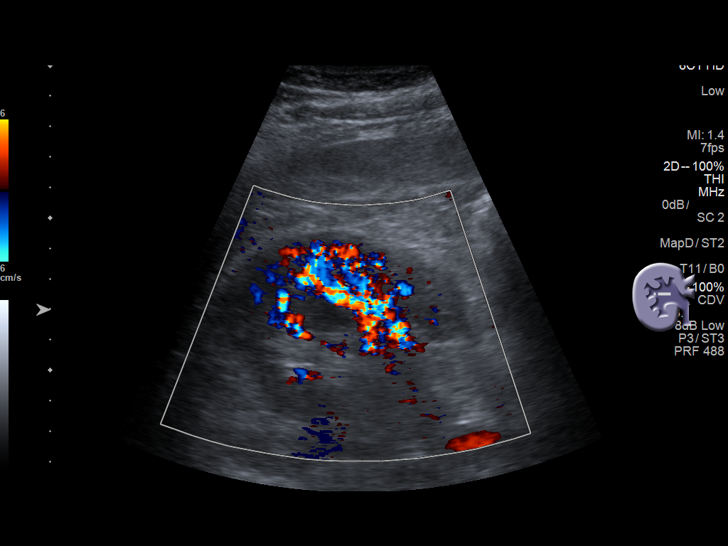
[im 16/35]
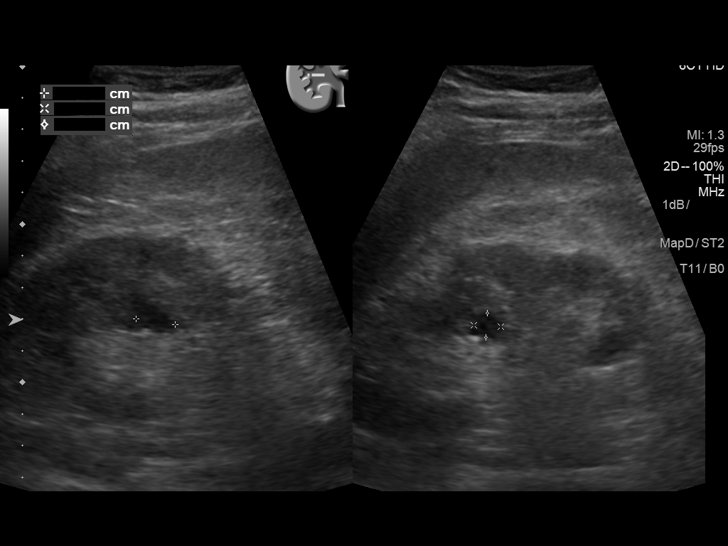
[im 19/35]
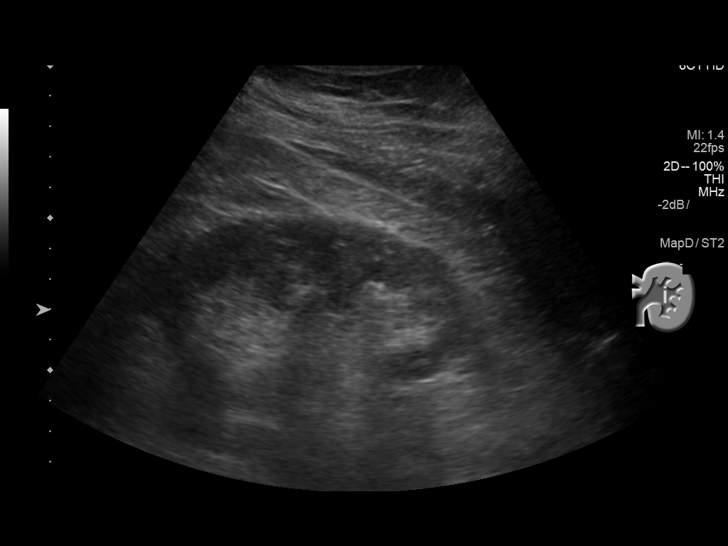
[im 22/35]
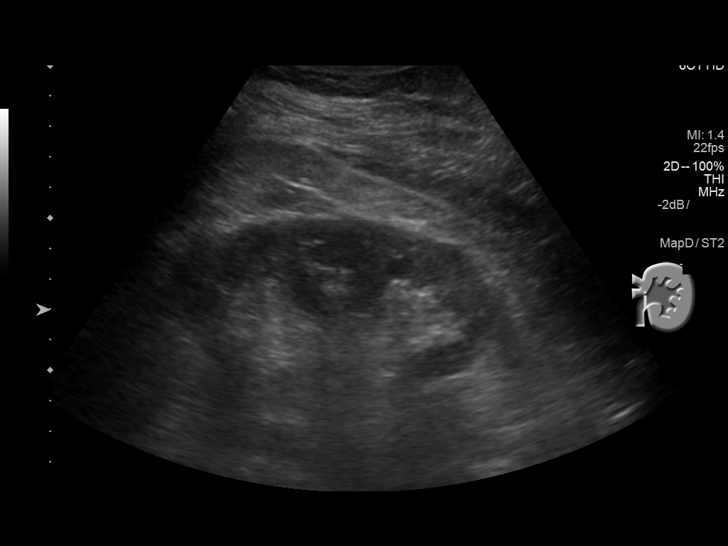
[im 23/35]
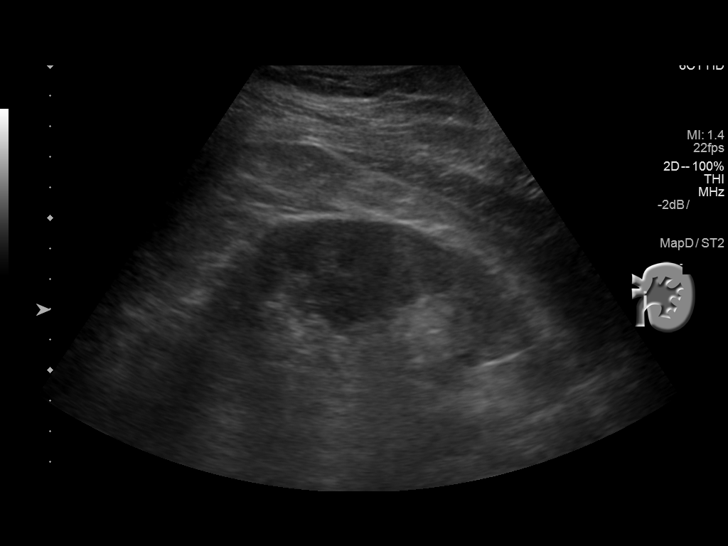
[im 26/35]
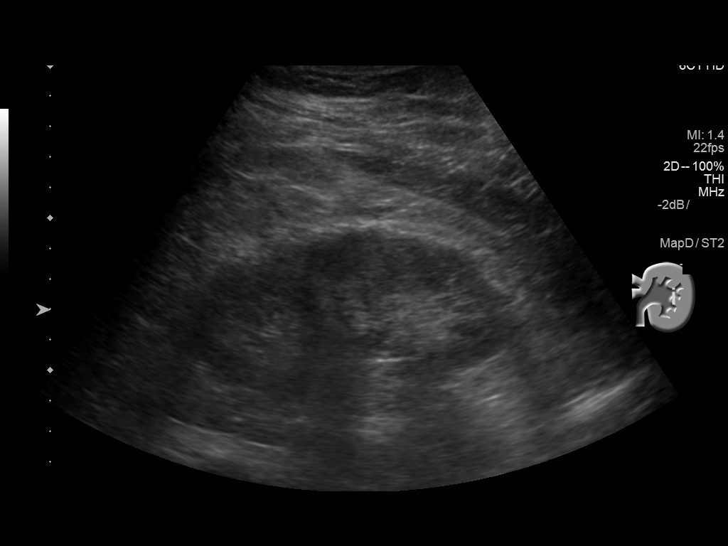
[im 29/35]
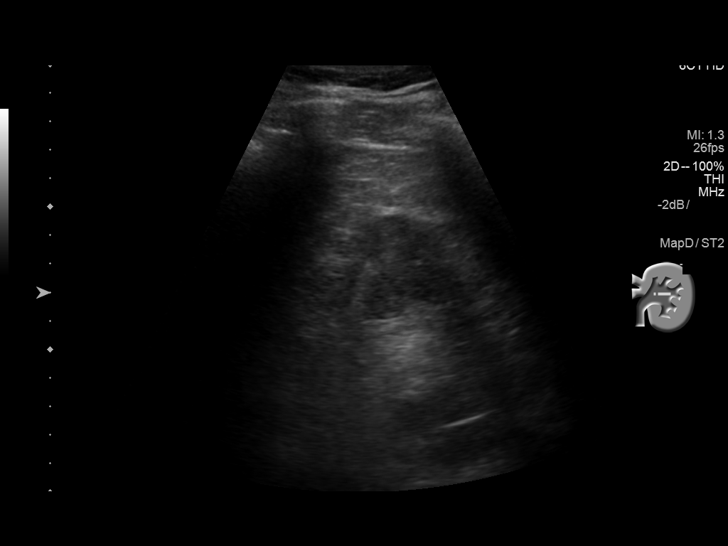
[im 32/35]
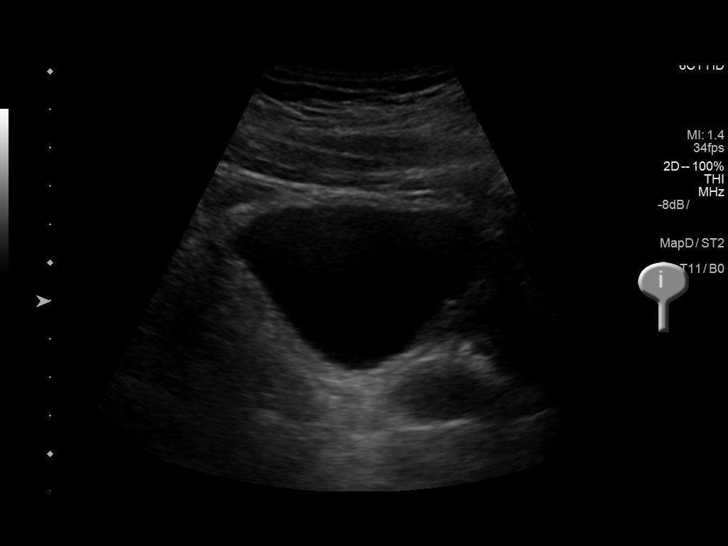
[im 35/35]
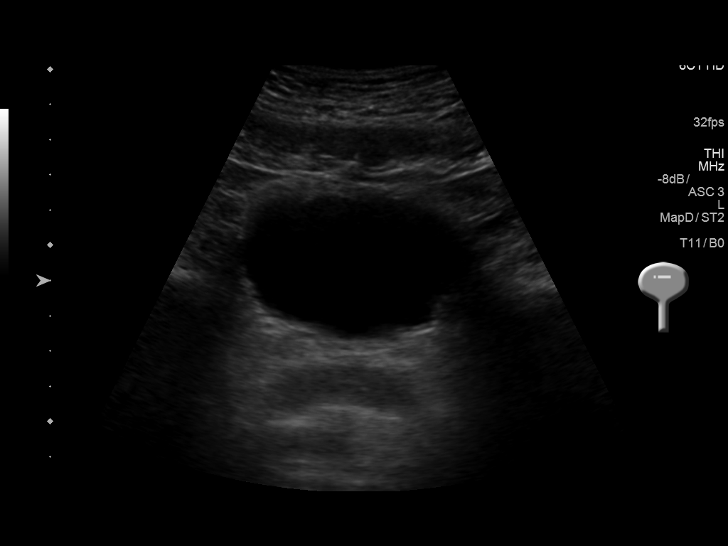

[14 of 25 positions shown; findings below may reference images not displayed]

FINDINGS: Right Kidney:

Length: 10.4 cm. Cyst within the midpole measures 1.3 x 0.9 x
cm.. Echogenicity within normal limits. No mass or hydronephrosis
visualized.

Left Kidney:

Length: 10.3 cm. Echogenicity within normal limits. No mass or
hydronephrosis visualized.

Bladder:

Appears normal for degree of bladder distention.
IMPRESSION: 1. No evidence for hydronephrosis.
2. Right renal cysts.

## 2017-03-19 IMAGING — CR DG LUMBAR SPINE 2-3V
3 series · 3 of 3 positions shown · non-contrast
Comparison: Abdominal radiograph 04/18/2015

CLINICAL DATA: Patient with radiating lower back pain for 2 months.

EXAM:
LUMBAR SPINE - 2-3 VIEW

[l-spine ap]
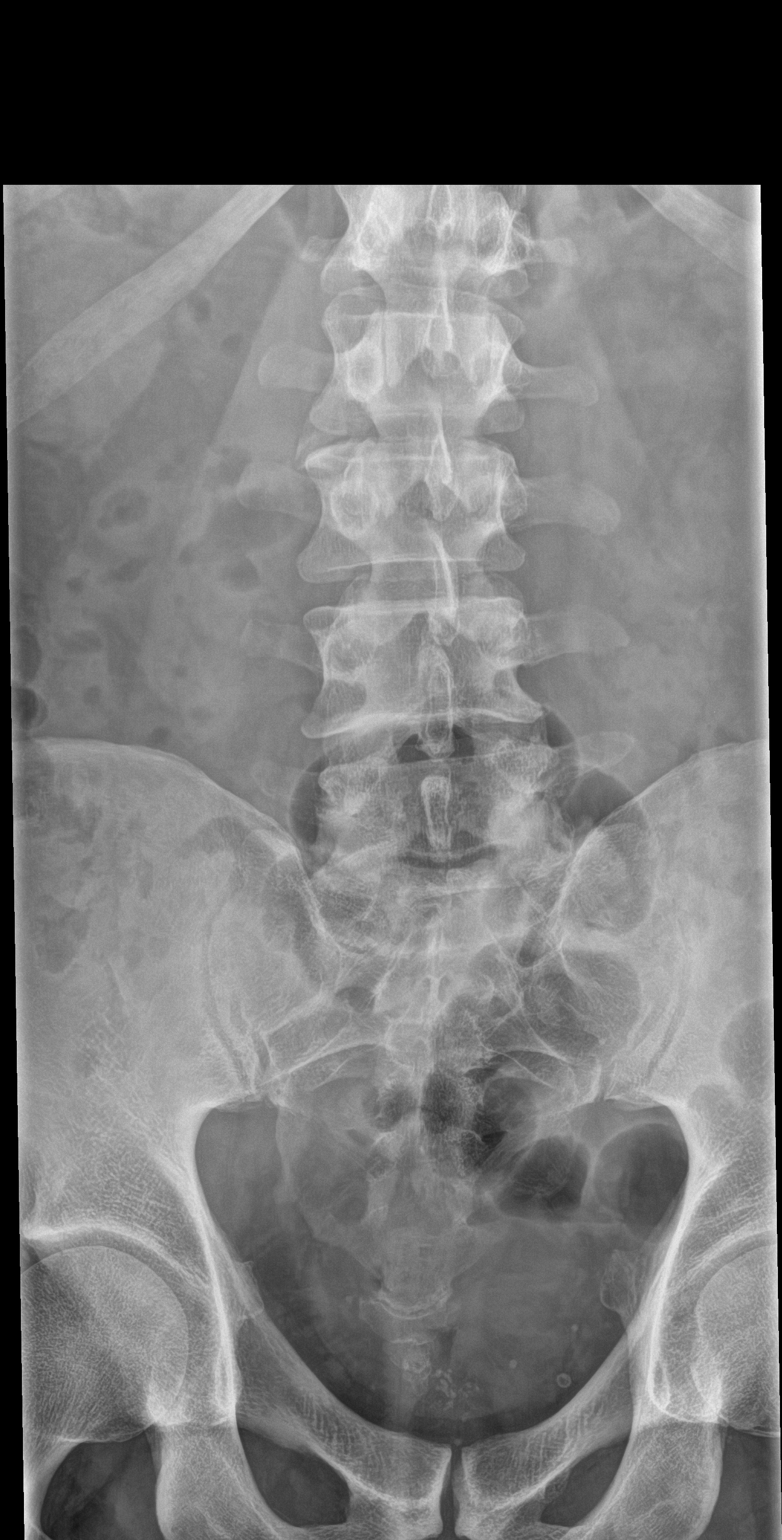

[l-spine lat]
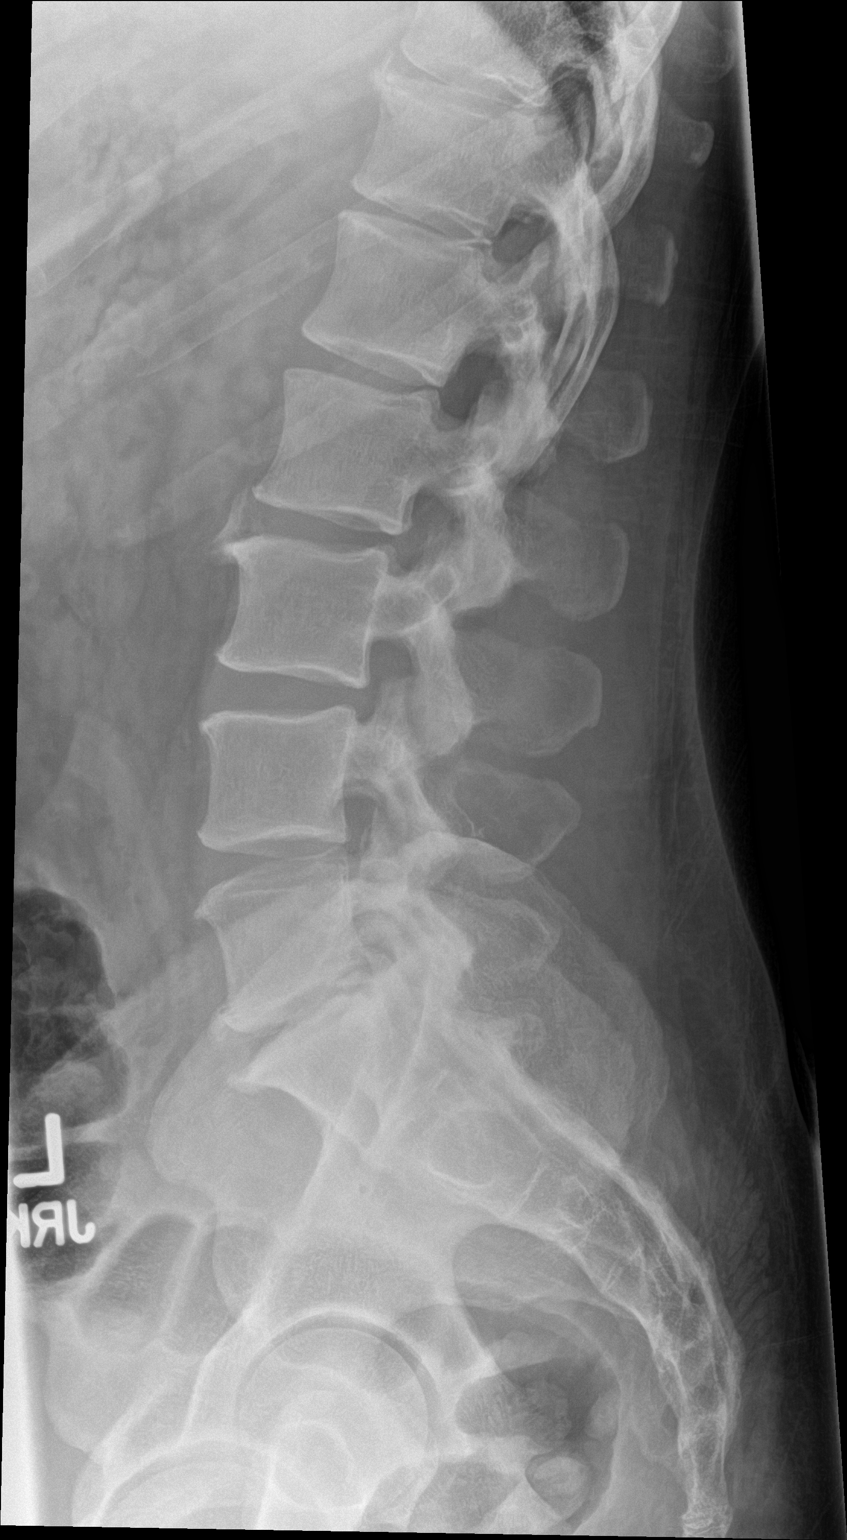

[l-spine spot]
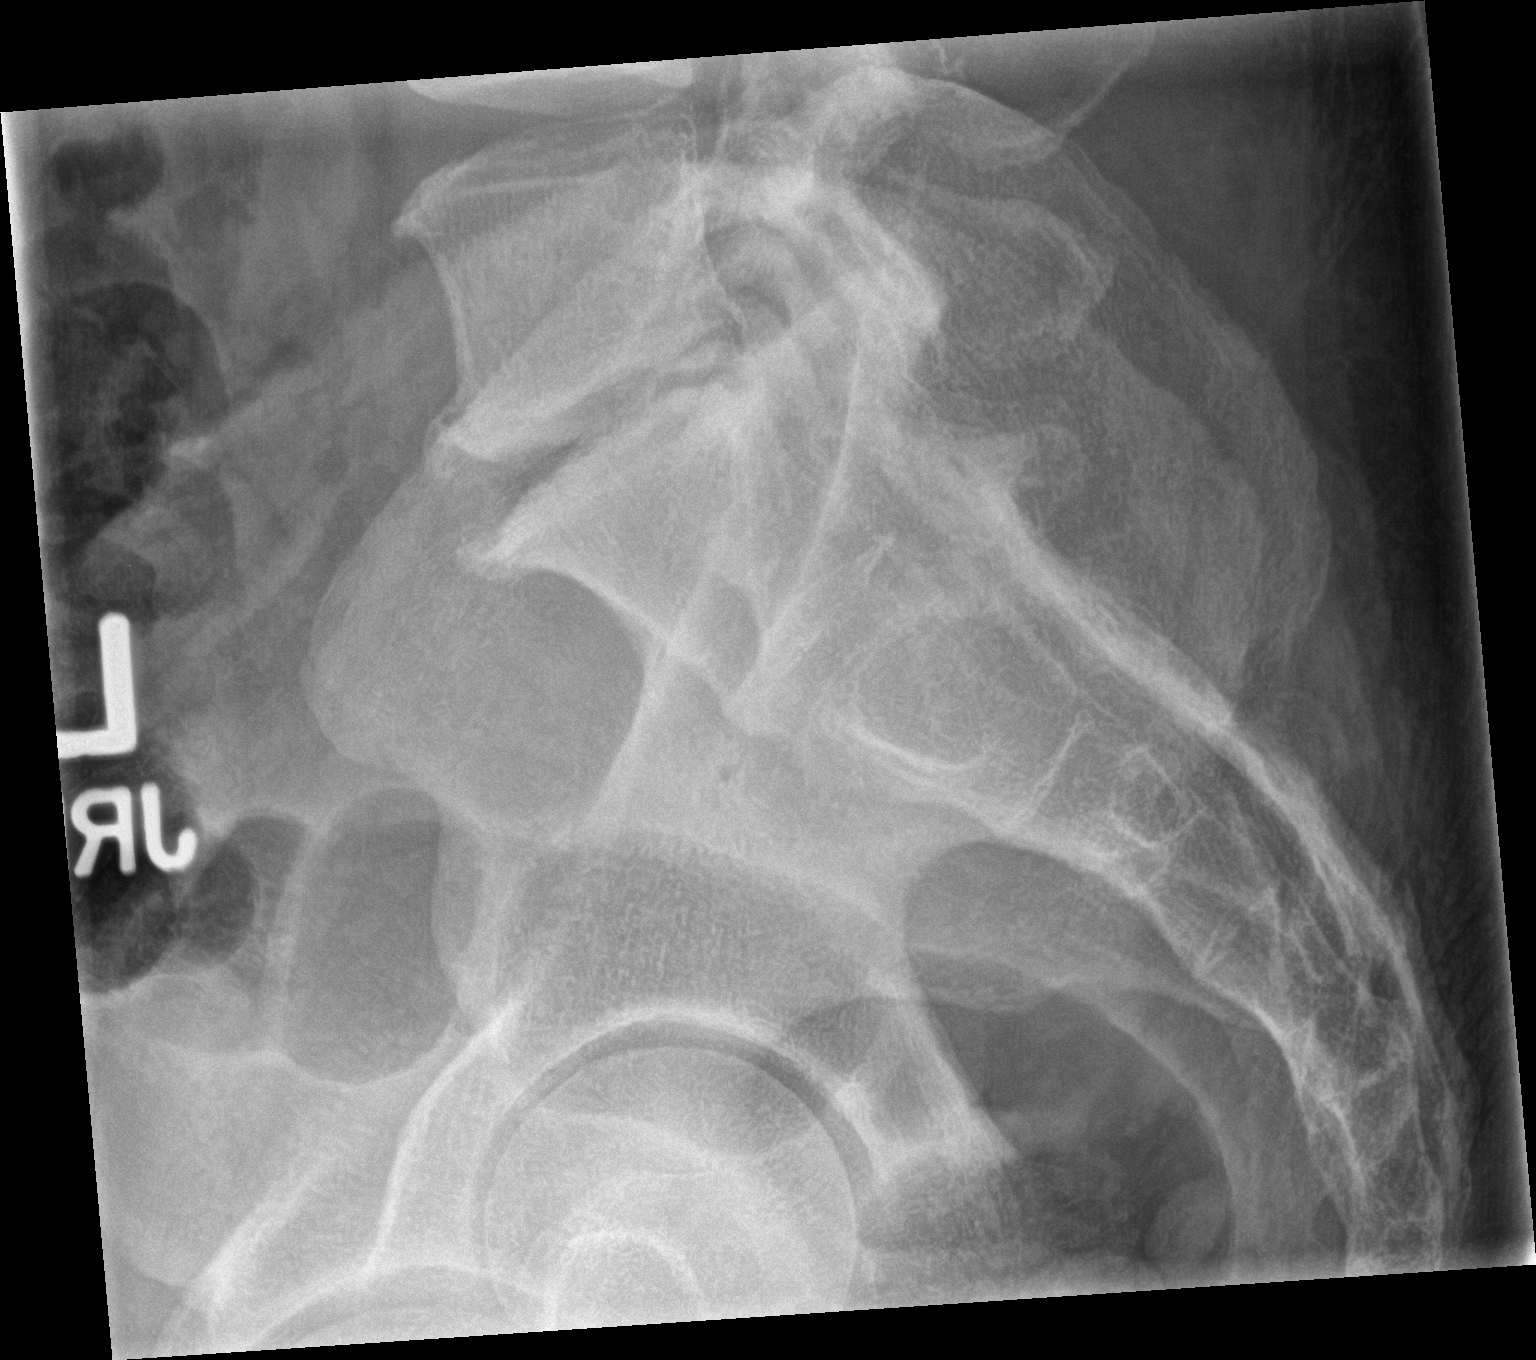

[3 of 3 positions shown; findings below may reference images not displayed]

FINDINGS: Normal anatomic alignment. No evidence for acute fracture or
dislocation. L2-3 and L5-S1 degenerative disc disease. L4-5 and
L5-S1 facet degenerative changes. Preservation of the vertebral body
heights. SI joints are unremarkable.
IMPRESSION: No acute osseous abnormality.

Lower lumbar spine degenerative disc and facet disease.
